# Patient Record
Sex: Female | Born: 1970 | Race: Black or African American | Hispanic: No | Marital: Married | Smoking: Never smoker
Health system: Southern US, Community
[De-identification: ages and names within clinical notes are randomized; demographics above are authoritative.]

## PROBLEM LIST (undated history)

## (undated) DIAGNOSIS — E079 Disorder of thyroid, unspecified: Secondary | ICD-10-CM

## (undated) HISTORY — DX: Disorder of thyroid, unspecified: E07.9

---

## 1998-06-02 ENCOUNTER — Emergency Department (HOSPITAL_COMMUNITY): Admission: EM | Admit: 1998-06-02 | Discharge: 1998-06-02 | Payer: Self-pay | Admitting: Internal Medicine

## 1999-05-07 ENCOUNTER — Other Ambulatory Visit: Admission: RE | Admit: 1999-05-07 | Discharge: 1999-05-07 | Payer: Self-pay | Admitting: *Deleted

## 2000-01-22 HISTORY — PX: FOOT SURGERY: SHX648

## 2002-05-14 ENCOUNTER — Other Ambulatory Visit: Admission: RE | Admit: 2002-05-14 | Discharge: 2002-05-14 | Payer: Self-pay | Admitting: Family Medicine

## 2003-07-01 ENCOUNTER — Other Ambulatory Visit: Admission: RE | Admit: 2003-07-01 | Discharge: 2003-07-01 | Payer: Self-pay | Admitting: Obstetrics and Gynecology

## 2003-09-15 ENCOUNTER — Ambulatory Visit (HOSPITAL_COMMUNITY): Admission: RE | Admit: 2003-09-15 | Discharge: 2003-09-15 | Payer: Self-pay | Admitting: Obstetrics and Gynecology

## 2003-11-16 ENCOUNTER — Encounter: Admission: RE | Admit: 2003-11-16 | Discharge: 2003-11-16 | Payer: Self-pay | Admitting: Family Medicine

## 2004-07-25 ENCOUNTER — Ambulatory Visit: Payer: Self-pay | Admitting: Family Medicine

## 2004-07-26 ENCOUNTER — Other Ambulatory Visit: Admission: RE | Admit: 2004-07-26 | Discharge: 2004-07-26 | Payer: Self-pay | Admitting: Family Medicine

## 2004-07-26 ENCOUNTER — Ambulatory Visit: Payer: Self-pay | Admitting: Family Medicine

## 2005-06-25 ENCOUNTER — Other Ambulatory Visit: Admission: RE | Admit: 2005-06-25 | Discharge: 2005-06-25 | Payer: Self-pay | Admitting: Family Medicine

## 2005-06-25 ENCOUNTER — Encounter: Payer: Self-pay | Admitting: Family Medicine

## 2005-06-25 ENCOUNTER — Ambulatory Visit: Payer: Self-pay | Admitting: Family Medicine

## 2005-07-01 ENCOUNTER — Ambulatory Visit: Payer: Self-pay | Admitting: Internal Medicine

## 2006-06-27 DIAGNOSIS — E039 Hypothyroidism, unspecified: Secondary | ICD-10-CM | POA: Insufficient documentation

## 2006-07-24 ENCOUNTER — Ambulatory Visit (HOSPITAL_COMMUNITY): Admission: RE | Admit: 2006-07-24 | Discharge: 2006-07-24 | Payer: Self-pay | Admitting: Obstetrics and Gynecology

## 2006-08-27 ENCOUNTER — Ambulatory Visit (HOSPITAL_COMMUNITY): Admission: RE | Admit: 2006-08-27 | Discharge: 2006-08-27 | Payer: Self-pay | Admitting: Obstetrics and Gynecology

## 2006-10-14 ENCOUNTER — Encounter: Payer: Self-pay | Admitting: Family Medicine

## 2006-11-10 ENCOUNTER — Ambulatory Visit: Payer: Self-pay | Admitting: Family Medicine

## 2007-12-29 ENCOUNTER — Telehealth (INDEPENDENT_AMBULATORY_CARE_PROVIDER_SITE_OTHER): Payer: Self-pay | Admitting: *Deleted

## 2008-02-10 ENCOUNTER — Encounter: Payer: Self-pay | Admitting: Family Medicine

## 2008-04-01 ENCOUNTER — Ambulatory Visit: Payer: Self-pay | Admitting: Family Medicine

## 2009-01-19 ENCOUNTER — Telehealth (INDEPENDENT_AMBULATORY_CARE_PROVIDER_SITE_OTHER): Payer: Self-pay | Admitting: *Deleted

## 2009-06-14 ENCOUNTER — Encounter: Payer: Self-pay | Admitting: Family Medicine

## 2009-06-14 LAB — CONVERTED CEMR LAB
HDL: 56 mg/dL
LDL Cholesterol: 91 mg/dL

## 2009-06-15 ENCOUNTER — Telehealth (INDEPENDENT_AMBULATORY_CARE_PROVIDER_SITE_OTHER): Payer: Self-pay | Admitting: *Deleted

## 2009-07-06 ENCOUNTER — Other Ambulatory Visit: Admission: RE | Admit: 2009-07-06 | Discharge: 2009-07-06 | Payer: Self-pay | Admitting: Family Medicine

## 2009-07-06 ENCOUNTER — Ambulatory Visit: Payer: Self-pay | Admitting: Family Medicine

## 2009-07-06 LAB — HM PAP SMEAR

## 2009-07-10 ENCOUNTER — Encounter (INDEPENDENT_AMBULATORY_CARE_PROVIDER_SITE_OTHER): Payer: Self-pay | Admitting: *Deleted

## 2009-07-10 LAB — CONVERTED CEMR LAB: Pap Smear: NEGATIVE

## 2010-02-20 NOTE — Letter (Signed)
Summary: Results Follow up Letter  [facility]  [address] [place], [place] [address]   Phone: [phone]  Fax: [phone]    07/10/2009 MRN: [id number]  Leah Ingram [address] [place], [place]  [address]  Dear [name],  The following are the results of your recent test(s):  Test         Result    Pap Smear:        Normal __x___  Not Normal _____ Comments: ______________________________________________________ Cholesterol: LDL(Bad cholesterol):         Your goal is less than:         HDL (Good cholesterol):       Your goal is more than: Comments:  ______________________________________________________ Mammogram:        Normal _____  Not Normal _____ Comments:  ___________________________________________________________________ Hemoccult:        Normal _____  Not normal _______ Comments:    _____________________________________________________________________ Other Tests:    We routinely do not discuss normal results over the telephone.  If you desire a copy of the results, or you have any questions about this information we can discuss them at your next office visit.   Sincerely,    Army Fossa CMA  July 10, 2009 2:34 PM

## 2010-02-20 NOTE — Progress Notes (Signed)
Summary: refill  Phone Note Refill Request Message from:  Patient on Jun 15, 2009 1:24 PM  Refills Requested: Medication #1:  SYNTHROID 88 MCG TABS 1 by mouth once daily [BMN] [facility] ---    Method Requested: Fax to Local Pharmacy Next Appointment Scheduled: [phone] Initial call taken by: Okey Regal [place],  Jun 15, 2009 1:25 PM    Prescriptions: SYNTHROID 88 MCG TABS (LEVOTHYROXINE SODIUM) 1 by mouth once daily Brand medically necessary #90 x 0   Entered by:   Army Fossa CMA   Authorized by:   Loreen Freud DO   Signed by:   Army Fossa CMA on 06/15/2009   Method used:   [facility]ronically to        [phone] (retail)       [address]       [place], [place]  [address]       Ph: [phone]       Fax: [phone]   RxID:   [id number]

## 2010-02-20 NOTE — Assessment & Plan Note (Signed)
Summary: CPX-PAP--PH   Vital Signs:  Patient profile:   40 year old female Menstrual status:  regular Height:      70 inches Weight:      204 pounds BMI:     29.38 Pulse rate:   68 / minute Pulse rhythm:   regular BP sitting:   120 / 80  (left arm) Cuff size:   regular  Vitals Entered By: Army Fossa CMA (July 06, 2009 2:25 PM) CC: Pt here for CPX, pap.   History of Present Illness: Pt here for cpe and pap.  Labs were done at work and pt brought them in for review.  Preventive Screening-Counseling & Management  Alcohol-Tobacco     Alcohol drinks/day: 0     Smoking Status: never     Passive Smoke Exposure: no  Caffeine-Diet-Exercise     Caffeine use/day: 1 cup a week     Caffeine Counseling: not indicated; caffeine use is not excessive or problematic     Diet Comments: watching what she eats     Diet Counseling: not indicated; diet is assessed to be healthy     Does Patient Exercise: no     Exercise Counseling: to improve exercise regimen  Hep-HIV-STD-Contraception     Hepatitis Risk: no risk noted     HIV Risk: no risk noted     STD Risk: no risk noted     Dental Visit-last 6 months yes     Dental Care Counseling: not indicated; dental care within six months     SBE monthly: yes     SBE Education/Counseling: to perform regular SBE  Safety-Violence-Falls     Seat Belt Use: yes     Seat Belt Counseling: not indicated; patient wears seat belts     Firearms in the Home: no firearms in the home     Firearm Counseling: not applicable     Smoke Detectors: yes     Smoke Detector Counseling: yes     Violence in the Home: no risk noted     Violence Counseling: not applicable     Sexual Abuse: no     Sexual Abuse Counseling: no      Sexual History:  currently monogamous and married.    Current Medications (verified): 1)  Synthroid 88 Mcg Tabs (Levothyroxine Sodium) .Marland Kitchen.. 1 By Mouth Once Daily 2)  Pnv .Marland Kitchen.. 1 By Mouth Once Daily  Allergies: 1)  !  Penicillin  Past History:  Past Medical History: Last updated: 04/01/2008 Hypothyroidism P2G1011  Past Surgical History: Last updated: 06/27/2006 Foot sx-2002  Family History: Last updated: 06/27/2006 Family History Diabetes 1st degree relative Family History Hypertension Family History Arrthymia Family History Heart dz  Social History: Last updated: 07/06/2009 Married Never Smoked Alcohol use-yes-rare Drug use-no Occupation: Economist --- cust service  Risk Factors: Alcohol Use: 0 (07/06/2009) Caffeine Use: 1 cup a week (07/06/2009) Diet: watching what she eats (07/06/2009) Exercise: no (07/06/2009)  Risk Factors: Smoking Status: never (07/06/2009) Passive Smoke Exposure: no (07/06/2009)  Family History: Reviewed history from 06/27/2006 and no changes required. Family History Diabetes 1st degree relative Family History Hypertension Family History Arrthymia Family History Heart dz  Social History: Reviewed history from 06/27/2006 and no changes required. Married Never Smoked Alcohol use-yes-rare Drug use-no Occupation: Economist --- cust service Does Patient Exercise:  no Dental Care w/in 6 mos.:  yes Sexual History:  currently monogamous, married Occupation:  employed  Review of Systems      See HPI General:  Denies  chills, fatigue, fever, loss of appetite, malaise, sleep disorder, sweats, weakness, and weight loss. Eyes:  Denies blurring, discharge, double vision, eye irritation, eye pain, halos, itching, light sensitivity, red eye, vision loss-1 eye, and vision loss-both eyes; optho--q1y. ENT:  Denies decreased hearing, difficulty swallowing, ear discharge, earache, hoarseness, nasal congestion, nosebleeds, postnasal drainage, ringing in ears, sinus pressure, and sore throat. CV:  Denies bluish discoloration of lips or nails, chest pain or discomfort, difficulty breathing at night, difficulty breathing while lying down, fainting, fatigue, leg cramps with  exertion, lightheadness, near fainting, palpitations, shortness of breath with exertion, swelling of feet, swelling of hands, and weight gain. Resp:  Denies chest discomfort, chest pain with inspiration, cough, coughing up blood, excessive snoring, hypersomnolence, morning headaches, pleuritic, shortness of breath, sputum productive, and wheezing. GI:  Denies abdominal pain, bloody stools, change in bowel habits, constipation, dark tarry stools, diarrhea, excessive appetite, gas, hemorrhoids, indigestion, and loss of appetite. GU:  Denies abnormal vaginal bleeding, decreased libido, discharge, dysuria, genital sores, hematuria, incontinence, nocturia, urinary frequency, and urinary hesitancy. MS:  Denies joint pain, joint redness, joint swelling, loss of strength, low back pain, mid back pain, muscle aches, muscle , cramps, muscle weakness, stiffness, and thoracic pain. Derm:  Denies changes in color of skin, changes in nail beds, dryness, excessive perspiration, flushing, hair loss, insect bite(s), itching, lesion(s), poor wound healing, and rash. Neuro:  Denies brief paralysis, difficulty with concentration, disturbances in coordination, falling down, headaches, inability to speak, memory loss, numbness, poor balance, seizures, sensation of room spinning, tingling, tremors, visual disturbances, and weakness. Psych:  Denies alternate hallucination ( auditory/visual), anxiety, depression, easily angered, easily tearful, irritability, mental problems, panic attacks, sense of great danger, suicidal thoughts/plans, thoughts of violence, unusual visions or sounds, and thoughts /plans of harming others. Endo:  Denies cold intolerance, excessive hunger, excessive thirst, excessive urination, heat intolerance, polyuria, and weight change. Heme:  Denies abnormal bruising, bleeding, enlarge lymph nodes, fevers, pallor, and skin discoloration. Allergy:  Denies hives or rash, itching eyes, persistent infections,  seasonal allergies, and sneezing.  Physical Exam  General:  Well-developed,well-nourished,in no acute distress; alert,appropriate and cooperative throughout examination Head:  Normocephalic and atraumatic without obvious abnormalities. No apparent alopecia or balding. Eyes:  vision grossly intact, pupils equal, pupils round, pupils reactive to light, and no injection.   Ears:  External ear exam shows no significant lesions or deformities.  Otoscopic examination reveals clear canals, tympanic membranes are intact bilaterally without bulging, retraction, inflammation or discharge. Hearing is grossly normal bilaterally. Nose:  External nasal examination shows no deformity or inflammation. Nasal mucosa are pink and moist without lesions or exudates. Mouth:  Oral mucosa and oropharynx without lesions or exudates.  Teeth in good repair. Neck:  No deformities, masses, or tenderness noted.no carotid bruits.   Chest Wall:  No deformities, masses, or tenderness noted. Breasts:  No mass, nodules, thickening, tenderness, bulging, retraction, inflamation, nipple discharge or skin changes noted.   Lungs:  Normal respiratory effort, chest expands symmetrically. Lungs are clear to auscultation, no crackles or wheezes. Heart:  normal rate and no murmur.   Abdomen:  Bowel sounds positive,abdomen soft and non-tender without masses, organomegaly or hernias noted. Genitalia:  Pelvic Exam:        External: normal female genitalia without lesions or masses        Vagina: normal without lesions or masses        Cervix: normal without lesions or masses        Adnexa: normal  bimanual exam without masses or fullness        Uterus: normal by palpation        Pap smear: performed Msk:  normal ROM, no joint tenderness, no joint swelling, no joint warmth, no redness over joints, no joint deformities, no joint instability, and no crepitation.   Pulses:  R posterior tibial normal, R dorsalis pedis normal, R carotid normal, L  posterior tibial normal, L dorsalis pedis normal, and L carotid normal.   Extremities:  No clubbing, cyanosis, edema, or deformity noted with normal full range of motion of all joints.   Neurologic:  No cranial nerve deficits noted. Station and gait are normal. Plantar reflexes are down-going bilaterally. DTRs are symmetrical throughout. Sensory, motor and coordinative functions appear intact. Skin:  Intact without suspicious lesions or rashes Cervical Nodes:  No lymphadenopathy noted Axillary Nodes:  No palpable lymphadenopathy Psych:  Cognition and judgment appear intact. Alert and cooperative with normal attention span and concentration. No apparent delusions, illusions, hallucinations   Impression & Recommendations:  Problem # 1:  PREVENTIVE HEALTH CARE (ICD-V70.0) labs from work reviewed ghm utd  Problem # 2:  HYPOTHYROIDISM (ICD-244.9)  labs from work reviewed Her updated medication list for this problem includes:    Synthroid 88 Mcg Tabs (Levothyroxine sodium) .Marland Kitchen... 1 by mouth once daily  Labs Reviewed: HDL: 56 (06/14/2009)   LDL: 91 (06/14/2009)     Complete Medication List: 1)  Synthroid 88 Mcg Tabs (Levothyroxine sodium) .Marland Kitchen.. 1 by mouth once daily 2)  Pnv  .Marland KitchenMarland Kitchen. 1 by mouth once daily Prescriptions: SYNTHROID 88 MCG TABS (LEVOTHYROXINE SODIUM) 1 by mouth once daily Brand medically necessary #90 x 3   Entered and Authorized by:   Loreen Freud DO   Signed by:   Loreen Freud DO on 07/06/2009   Method used:   Printed then faxed to ...       [facility] (mail-order)       [address]       [place], [place]  [address]       Ph: [phone]       Fax: [phone]   RxID:   [id number] SYNTHROID 88 MCG TABS (LEVOTHYROXINE SODIUM) 1 by mouth once daily Brand medically necessary #90 x 3   Entered and Authorized by:   Loreen Freud DO   Signed by:   Loreen Freud DO on 07/06/2009   Method used:   Print then Give to Patient   RxID:   [id number]   Flu Vaccine Result Date:   11/30/2008 Flu Vaccine Result:  given Flu Vaccine Next Due:  1 yr HDL Result Date:  06/14/2009 HDL Result:  56 HDL Next Due:  1 yr LDL Result Date:  06/14/2009 LDL Result:  91 LDL Next Due:  1 yr           Appended Document: CPX-PAP--PH    Clinical Lists Changes  Orders: Added new Service order of Tdap => 54yrs IM (64403) - Signed Added new Service order of Admin 1st Vaccine (47425) - Signed Observations: Added new observation of TD BOOSTERLO: [id number] (07/06/2009 14:56) Added new observation of TD BOOST EXP: 04/15/2011 (07/06/2009 14:56) Added new observation of TD BOOSTERBY: Danielle Barmer CMA (07/06/2009 14:56) Added new observation of TD BOOSTERRT: IM (07/06/2009 14:56) Added new observation of TDBOOSTERDSE: 0.5 ml (07/06/2009 14:56) Added new observation of TD BOOSTERMF: GlaxoSmithKline (07/06/2009 14:56) Added new observation of TD BOOST SIT: left deltoid (07/06/2009 14:56) Added new observation of TD BOOSTER: Tdap (07/06/2009 14:56)  Immunizations Administered:  Tetanus Vaccine:    Vaccine Type: Tdap    Site: left deltoid    Mfr: GlaxoSmithKline    Dose: 0.5 ml    Route: IM    Given by: Army Fossa CMA    Exp. Date: 04/15/2011    Lot #: [id number]

## 2010-03-09 ENCOUNTER — Encounter: Payer: Self-pay | Admitting: Family Medicine

## 2010-03-09 ENCOUNTER — Ambulatory Visit (INDEPENDENT_AMBULATORY_CARE_PROVIDER_SITE_OTHER): Payer: Managed Care, Other (non HMO) | Admitting: Family Medicine

## 2010-03-09 ENCOUNTER — Other Ambulatory Visit: Payer: Self-pay | Admitting: Family Medicine

## 2010-03-09 DIAGNOSIS — M25519 Pain in unspecified shoulder: Secondary | ICD-10-CM

## 2010-03-09 DIAGNOSIS — N6459 Other signs and symptoms in breast: Secondary | ICD-10-CM

## 2010-03-09 DIAGNOSIS — N63 Unspecified lump in unspecified breast: Secondary | ICD-10-CM | POA: Insufficient documentation

## 2010-03-14 ENCOUNTER — Encounter: Payer: Self-pay | Admitting: Family Medicine

## 2010-03-14 ENCOUNTER — Ambulatory Visit
Admission: RE | Admit: 2010-03-14 | Discharge: 2010-03-14 | Disposition: A | Discharge status: Home / Self Care | Payer: Managed Care, Other (non HMO) | Source: Ambulatory Visit | Attending: Family Medicine | Admitting: Family Medicine

## 2010-03-14 DIAGNOSIS — N6459 Other signs and symptoms in breast: Secondary | ICD-10-CM

## 2010-03-14 NOTE — Assessment & Plan Note (Signed)
Summary: cold, area under left breast feels "bigger", no pain or lumps...   Vital Signs:  Patient profile:   40 year old female Menstrual status:  regular Weight:      205.6 pounds Temp:     98.8 degrees F oral BP sitting:   112 / 60  (left arm) Cuff size:   large  Vitals Entered By: Almeta Monas CMA Duncan Dull) (March 09, 2010 2:03 PM) CC: x3weeks c/o issues with the left breast-- also c/o right shoulder pain x61mos   History of Present Illness: Pt here c/o L breast feeling swollen and cold.  no pain, no lumps felt. Pt also c/o R shoulder pain x 6 months.  No known injury.  Current Medications (verified): 1)  Synthroid 88 Mcg Tabs (Levothyroxine Sodium) .Marland Kitchen.. 1 By Mouth Once Daily 2)  Pnv .Marland Kitchen.. 1 By Mouth Once Daily 3)  Celebrex 200 Mg Caps (Celecoxib) .Marland Kitchen.. 1 By Mouth Once Daily  Allergies (verified): 1)  ! Penicillin  Past History:  Family History: Last updated: 06/27/2006 Family History Diabetes 1st degree relative Family History Hypertension Family History Arrthymia Family History Heart dz  Social History: Last updated: 07/06/2009 Married Never Smoked Alcohol use-yes-rare Drug use-no Occupation: Economist --- cust service  Risk Factors: Alcohol Use: 0 (07/06/2009) Caffeine Use: 1 cup a week (07/06/2009) Diet: watching what she eats (07/06/2009) Exercise: no (07/06/2009)  Risk Factors: Smoking Status: never (07/06/2009) Passive Smoke Exposure: no (07/06/2009)  Past medical, surgical, family and social histories (including risk factors) reviewed for relevance to current acute and chronic problems.  Past Medical History: Reviewed history from 04/01/2008 and no changes required. Hypothyroidism P2G1011  Past Surgical History: Reviewed history from 06/27/2006 and no changes required. Foot sx-2002  Family History: Reviewed history from 06/27/2006 and no changes required. Family History Diabetes 1st degree relative Family History Hypertension Family  History Arrthymia Family History Heart dz  Social History: Reviewed history from 07/06/2009 and no changes required. Married Never Smoked Alcohol use-yes-rare Drug use-no Occupation: Economist --- cust service  Review of Systems      See HPI  Physical Exam  General:  Well-developed,well-nourished,in no acute distress; alert,appropriate and cooperative throughout examination Breasts:  L upper outer quad thickening skin/areolae normal, no masses, no nipple discharge, no tenderness, and no adenopathy.   Msk:  + tenderness R AC joint  pt able to move with full ROM but it does hurt to do it Extremities:  No clubbing, cyanosis, edema, or deformity noted with normal full range of motion of all joints.   Skin:  Intact without suspicious lesions or rashes Psych:  Oriented X3 and normally interactive.     Impression & Recommendations:  Problem # 1:  SHOULDER PAIN, RIGHT (ICD-719.41)  Discussed shoulder exercises, use of moist heat or ice, and medication. --- ortho if no better  Her updated medication list for this problem includes:    Celebrex 200 Mg Caps (Celecoxib) .Marland Kitchen... 1 by mouth once daily  Problem # 2:  BREAST MASS, LEFT (ICD-611.72)  Orders: Radiology Referral (Radiology)  Mammogram was ordered today.  Complete Medication List: 1)  Synthroid 88 Mcg Tabs (Levothyroxine sodium) .Marland Kitchen.. 1 by mouth once daily 2)  Pnv  .Marland KitchenMarland Kitchen. 1 by mouth once daily 3)  Celebrex 200 Mg Caps (Celecoxib) .Marland Kitchen.. 1 by mouth once daily   Orders Added: 1)  Est. Patient Level III [[address]] 2)  Radiology Referral [Radiology]

## 2010-07-07 ENCOUNTER — Encounter: Payer: Self-pay | Admitting: Family Medicine

## 2010-07-24 ENCOUNTER — Encounter: Payer: Self-pay | Admitting: Family Medicine

## 2010-07-24 ENCOUNTER — Ambulatory Visit (INDEPENDENT_AMBULATORY_CARE_PROVIDER_SITE_OTHER): Payer: Managed Care, Other (non HMO) | Admitting: Family Medicine

## 2010-07-24 ENCOUNTER — Other Ambulatory Visit (HOSPITAL_COMMUNITY)
Admission: RE | Admit: 2010-07-24 | Discharge: 2010-07-24 | Disposition: A | Discharge status: Home / Self Care | Payer: Managed Care, Other (non HMO) | Source: Ambulatory Visit | Attending: Family Medicine | Admitting: Family Medicine

## 2010-07-24 VITALS — BP 122/74 | HR 80 | Temp 98.8°F | Ht 70.0 in | Wt 208.2 lb

## 2010-07-24 DIAGNOSIS — E039 Hypothyroidism, unspecified: Secondary | ICD-10-CM

## 2010-07-24 DIAGNOSIS — Z0000 Encounter for general adult medical examination without abnormal findings: Secondary | ICD-10-CM

## 2010-07-24 DIAGNOSIS — Z01419 Encounter for gynecological examination (general) (routine) without abnormal findings: Secondary | ICD-10-CM | POA: Insufficient documentation

## 2010-07-24 MED ORDER — LEVOTHYROXINE SODIUM 88 MCG PO TABS: 88.0000 ug | ORAL_TABLET | Freq: Every day | ORAL | Status: DC

## 2010-07-24 NOTE — Progress Notes (Signed)
  Subjective:     Leah Ingram is a 40 y.o. female and is here for a comprehensive physical exam. The patient reports no problems.  History   Social History  . Marital Status: Married    Spouse Name: N/A    Number of Children: N/A  . Years of Education: N/A   Occupational History  . Not on file.   Social History Main Topics  . Smoking status: Never Smoker   . Smokeless tobacco: Never Used  . Alcohol Use: Yes     very rare  . Drug Use: No  . Sexually Active: Yes -- Female partner(s)   Other Topics Concern  . Not on file   Social History Narrative  . No narrative on file   Health Maintenance  Topic Date Due  . Pap Smear  07/06/2012  . Tetanus/tdap  07/07/2019    The following portions of the patient's history were reviewed and updated as appropriate: allergies, current medications, past family history, past medical history, past social history, past surgical history and problem list.  Review of Systems Review of Systems  Constitutional: Negative for activity change, appetite change and fatigue.  HENT: Negative for hearing loss, congestion, tinnitus and ear discharge.  dentist q1m Eyes: Negative for visual disturbance (see optho q1y -- vision corrected to 20/20 with glasses).  Respiratory: Negative for cough, chest tightness and shortness of breath.   Cardiovascular: Negative for chest pain, palpitations and leg swelling.  Gastrointestinal: Negative for abdominal pain, diarrhea, constipation and abdominal distention.  Genitourinary: Negative for urgency, frequency, decreased urine volume and difficulty urinating.  Musculoskeletal: Negative for back pain, arthralgias and gait problem.  Skin: Negative for color change, pallor and rash.  Neurological: Negative for dizziness, light-headedness, numbness and headaches.  Hematological: Negative for adenopathy. Does not bruise/bleed easily.  Psychiatric/Behavioral: Negative for suicidal ideas, confusion, sleep disturbance,  self-injury, dysphoric mood, decreased concentration and agitation.       Objective:    BP 122/74  Pulse 80  Temp(Src) 98.8 F (37.1 C) (Oral)  Ht 5\' 10"  (1.778 m)  Wt 208 lb 3.2 oz (94.439 kg)  BMI 29.87 kg/m2  SpO2 97%  LMP 07/13/2010 General appearance: alert, cooperative, appears stated age and no distress Head: Normocephalic, without obvious abnormality, atraumatic Eyes: conjunctivae/corneas clear. PERRL, EOM's intact. Fundi benign. Ears: normal TM's and external ear canals both ears Nose: Nares normal. Septum midline. Mucosa normal. No drainage or sinus tenderness. Throat: lips, mucosa, and tongue normal; teeth and gums normal Neck: no adenopathy, no carotid bruit, no JVD, supple, symmetrical, trachea midline and thyroid not enlarged, symmetric, no tenderness/mass/nodules Back: symmetric, no curvature. ROM normal. No CVA tenderness. Lungs: clear to auscultation bilaterally Breasts: normal appearance, no masses or tenderness Heart: regular rate and rhythm, S1, S2 normal, no murmur, click, rub or gallop Abdomen: soft, non-tender; bowel sounds normal; no masses,  no organomegaly Pelvic: cervix normal in appearance, external genitalia normal, no adnexal masses or tenderness, no cervical motion tenderness, rectovaginal septum normal, uterus normal size, shape, and consistency and vagina normal without discharge Extremities: extremities normal, atraumatic, no cyanosis or edema Pulses: 2+ and symmetric Skin: Skin color, texture, turgor normal. No rashes or lesions Lymph nodes: Cervical, supraclavicular, and axillary nodes normal. Neurologic: Grossly normal Psych--no depression, no anxiety   Assessment:    Healthy female exam.    Plan:     See After Visit Summary for Counseling Recommendations

## 2010-07-24 NOTE — Assessment & Plan Note (Signed)
Labs reviewed with pt---done at work and scanned in con't meds

## 2010-07-24 NOTE — Patient Instructions (Signed)

## 2011-08-15 ENCOUNTER — Telehealth: Payer: Self-pay | Admitting: Family Medicine

## 2011-08-15 DIAGNOSIS — E039 Hypothyroidism, unspecified: Secondary | ICD-10-CM

## 2011-08-15 MED ORDER — LEVOTHYROXINE SODIUM 88 MCG PO TABS: 88.0000 ug | ORAL_TABLET | Freq: Every day | ORAL | Status: DC

## 2011-08-15 NOTE — Telephone Encounter (Signed)
Refill: synthroid tab. [facility]

## 2011-08-16 MED ORDER — LEVOTHYROXINE SODIUM 88 MCG PO TABS: 88.0000 ug | ORAL_TABLET | Freq: Every day | ORAL | Status: DC

## 2011-08-16 NOTE — Addendum Note (Signed)
Addended by: Arnette Norris on: 08/16/2011 11:04 AM   Modules accepted: Orders

## 2011-08-23 ENCOUNTER — Encounter: Payer: Managed Care, Other (non HMO) | Admitting: Family Medicine

## 2011-10-11 ENCOUNTER — Ambulatory Visit (INDEPENDENT_AMBULATORY_CARE_PROVIDER_SITE_OTHER): Payer: Managed Care, Other (non HMO) | Admitting: Family Medicine

## 2011-10-11 ENCOUNTER — Other Ambulatory Visit (HOSPITAL_COMMUNITY)
Admission: RE | Admit: 2011-10-11 | Discharge: 2011-10-11 | Disposition: A | Discharge status: Home / Self Care | Payer: Managed Care, Other (non HMO) | Source: Ambulatory Visit | Attending: Family Medicine | Admitting: Family Medicine

## 2011-10-11 ENCOUNTER — Encounter: Payer: Self-pay | Admitting: Family Medicine

## 2011-10-11 VITALS — BP 120/72 | HR 77 | Temp 98.5°F | Ht 69.25 in | Wt 212.4 lb

## 2011-10-11 DIAGNOSIS — Z0000 Encounter for general adult medical examination without abnormal findings: Secondary | ICD-10-CM

## 2011-10-11 DIAGNOSIS — E039 Hypothyroidism, unspecified: Secondary | ICD-10-CM

## 2011-10-11 DIAGNOSIS — Z01419 Encounter for gynecological examination (general) (routine) without abnormal findings: Secondary | ICD-10-CM | POA: Insufficient documentation

## 2011-10-11 DIAGNOSIS — Z124 Encounter for screening for malignant neoplasm of cervix: Secondary | ICD-10-CM

## 2011-10-11 LAB — CBC WITH DIFFERENTIAL/PLATELET
Basophils Absolute: 0 10*3/uL (ref 0.0–0.1)
Basophils Relative: 0.6 % (ref 0.0–3.0)
Eosinophils Absolute: 0.1 10*3/uL (ref 0.0–0.7)
Eosinophils Relative: 2 % (ref 0.0–5.0)
HCT: 37.5 % (ref 36.0–46.0)
Hemoglobin: 12.3 g/dL (ref 12.0–15.0)
Lymphocytes Relative: 33 % (ref 12.0–46.0)
Lymphs Abs: 2 10*3/uL (ref 0.7–4.0)
MCHC: 32.7 g/dL (ref 30.0–36.0)
MCV: 86.6 fl (ref 78.0–100.0)
Monocytes Absolute: 0.4 10*3/uL (ref 0.1–1.0)
Monocytes Relative: 6.9 % (ref 3.0–12.0)
Neutro Abs: 3.4 10*3/uL (ref 1.4–7.7)
Neutrophils Relative %: 57.5 % (ref 43.0–77.0)
Platelets: 179 10*3/uL (ref 150.0–400.0)
RBC: 4.33 Mil/uL (ref 3.87–5.11)
RDW: 14.2 % (ref 11.5–14.6)
WBC: 5.9 10*3/uL (ref 4.5–10.5)

## 2011-10-11 LAB — HEPATIC FUNCTION PANEL
ALT: 13 U/L (ref 0–35)
AST: 17 U/L (ref 0–37)
Albumin: 3.8 g/dL (ref 3.5–5.2)
Alkaline Phosphatase: 94 U/L (ref 39–117)
Bilirubin, Direct: 0 mg/dL (ref 0.0–0.3)
Total Bilirubin: 0.4 mg/dL (ref 0.3–1.2)
Total Protein: 7.7 g/dL (ref 6.0–8.3)

## 2011-10-11 LAB — BASIC METABOLIC PANEL
BUN: 11 mg/dL (ref 6–23)
CO2: 28 mEq/L (ref 19–32)
Calcium: 9 mg/dL (ref 8.4–10.5)
Chloride: 104 mEq/L (ref 96–112)
Creatinine, Ser: 0.8 mg/dL (ref 0.4–1.2)
GFR: 107.38 mL/min (ref >60.00)
Glucose, Bld: 87 mg/dL (ref 70–99)
Potassium: 3.4 mEq/L — ABNORMAL LOW (ref 3.5–5.1)
Sodium: 137 mEq/L (ref 135–145)

## 2011-10-11 LAB — LIPID PANEL
Cholesterol: 159 mg/dL (ref 0–200)
HDL: 45.2 mg/dL (ref >39.00)
LDL Cholesterol: 100 mg/dL — ABNORMAL HIGH (ref 0–99)
Total CHOL/HDL Ratio: 4
Triglycerides: 70 mg/dL (ref 0.0–149.0)
VLDL: 14 mg/dL (ref 0.0–40.0)

## 2011-10-11 LAB — TSH: TSH: 1.2 u[IU]/mL (ref 0.35–5.50)

## 2011-10-11 NOTE — Assessment & Plan Note (Signed)
Check lab Con't meds

## 2011-10-11 NOTE — Progress Notes (Signed)
Subjective:     Leah Ingram is a 41 y.o. female and is here for a comprehensive physical exam. The patient reports no problems.  History   Social History  . Marital Status: Married    Spouse Name: N/A    Number of Children: N/A  . 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 Smoking status: Never Smoker   . Smokeless tobacco: Never Used  . Alcohol Use: Yes     very rare  . Drug Use: No  . Sexually Active: Yes -- Female partner(s)   Other Topics Concern  . Not on file   Social History Narrative   Exercise--  no   Health Maintenance  Topic Date Due  . Influenza Vaccine  09/22/2011  . Pap Smear  10/11/2014  . Tetanus/tdap  07/07/2019    The following portions of the patient's history were reviewed and updated as appropriate: allergies, current medications, past family history, past medical history, past social history, past surgical history and problem list.  Review of Systems Review of Systems  Constitutional: Negative for activity change, appetite change and fatigue.  HENT: Negative for hearing loss, congestion, tinnitus and ear discharge.  dentist q31m Eyes: Negative for visual disturbance (see optho q1y -- vision corrected to 20/20 with contacts).  Respiratory: Negative for cough, chest tightness and shortness of breath.   Cardiovascular: Negative for chest pain, palpitations and leg swelling.  Gastrointestinal: Negative for abdominal pain, diarrhea, constipation and abdominal distention.  Genitourinary: Negative for urgency, frequency, decreased urine volume and difficulty urinating.  Musculoskeletal: Negative for back pain, arthralgias and gait problem.  Skin: Negative for color change, pallor and rash.  Neurological: Negative for dizziness, light-headedness, numbness and headaches.  Hematological: Negative for adenopathy. Does not bruise/bleed easily.  Psychiatric/Behavioral: Negative for suicidal ideas, confusion,  sleep disturbance, self-injury, dysphoric mood, decreased concentration and agitation.       Objective:    BP 120/72  Pulse 77  Temp 98.5 F (36.9 C) (Oral)  Ht 5' 9.25" (1.759 m)  Wt 212 lb 6.4 oz (96.344 kg)  BMI 31.14 kg/m2  SpO2 95%  LMP 09/20/2011 General appearance: alert, cooperative, appears stated age and no distress Head: Normocephalic, without obvious abnormality, atraumatic Eyes: conjunctivae/corneas clear. PERRL, EOM's intact. Fundi benign. Ears: normal TM's and external ear canals both ears Nose: Nares normal. Septum midline. Mucosa normal. No drainage or sinus tenderness. Throat: lips, mucosa, and tongue normal; teeth and gums normal Neck: no adenopathy, no carotid bruit, no JVD, supple, symmetrical, trachea midline and thyroid not enlarged, symmetric, no tenderness/mass/nodules Back: symmetric, no curvature. ROM normal. No CVA tenderness. Lungs: clear to auscultation bilaterally Breasts: normal appearance, no masses or tenderness Heart: regular rate and rhythm, S1, S2 normal, no murmur, click, rub or gallop Abdomen: soft, non-tender; bowel sounds normal; no masses,  no organomegaly Pelvic: cervix normal in appearance, external genitalia normal, no adnexal masses or tenderness, no cervical motion tenderness, rectovaginal septum normal, uterus normal size, shape, and consistency and vagina normal without discharge Extremities: extremities normal, atraumatic, no cyanosis or edema Pulses: 2+ and symmetric Skin: Skin color, texture, turgor normal. No rashes or lesions Lymph nodes: Cervical, supraclavicular, and axillary nodes normal. Neurologic: Alert and oriented X 3, normal strength and tone. Normal symmetric reflexes. Normal coordination and gait psych-- no depression, anxiety    Assessment:    Healthy female exam.      Plan:  Check labs  ghm utd See After Visit Summary for Counseling Recommendations

## 2011-10-11 NOTE — Patient Instructions (Addendum)
Preventive Care for Adults, Female A healthy lifestyle and preventive care can promote health and wellness. Preventive health guidelines for women include the following key practices.  A routine yearly physical is a good way to check with your caregiver about your health and preventive screening. It is a chance to share any concerns and updates on your health, and to receive a thorough exam.   Visit your dentist for a routine exam and preventive care every 6 months. Brush your teeth twice a day and floss once a day. Good oral hygiene prevents tooth decay and gum disease.   The frequency of eye exams is based on your age, health, family medical history, use of contact lenses, and other factors. Follow your caregiver's recommendations for frequency of eye exams.   Eat a healthy diet. Foods like vegetables, fruits, whole grains, low-fat dairy products, and lean protein foods contain the nutrients you need without too many calories. Decrease your intake of foods high in solid fats, added sugars, and salt. Eat the right amount of calories for you.Get information about a proper diet from your caregiver, if necessary.   Regular physical exercise is one of the most important things you can do for your health. Most adults should get at least 150 minutes of moderate-intensity exercise (any activity that increases your heart rate and causes you to sweat) each week. In addition, most adults need muscle-strengthening exercises on 2 or more days a week.   Maintain a healthy weight. The body mass index (BMI) is a screening tool to identify possible weight problems. It provides an estimate of body fat based on height and weight. Your caregiver can help determine your BMI, and can help you achieve or maintain a healthy weight.For adults 20 years and older:   A BMI below 18.5 is considered underweight.   A BMI of 18.5 to 24.9 is normal.   A BMI of 25 to 29.9 is considered overweight.   A BMI of 30 and above is  considered obese.   Maintain normal blood lipids and cholesterol levels by exercising and minimizing your intake of saturated fat. Eat a balanced diet with plenty of fruit and vegetables. Blood tests for lipids and cholesterol should begin at age 20 and be repeated every 5 years. If your lipid or cholesterol levels are high, you are over 50, or you are at high risk for heart disease, you may need your cholesterol levels checked more frequently.Ongoing high lipid and cholesterol levels should be treated with medicines if diet and exercise are not effective.   If you smoke, find out from your caregiver how to quit. If you do not use tobacco, do not start.   If you are pregnant, do not drink alcohol. If you are breastfeeding, be very cautious about drinking alcohol. If you are not pregnant and choose to drink alcohol, do not exceed 1 drink per day. One drink is considered to be 12 ounces (355 mL) of beer, 5 ounces (148 mL) of wine, or 1.5 ounces (44 mL) of liquor.   Avoid use of street drugs. Do not share needles with anyone. Ask for help if you need support or instructions about stopping the use of drugs.   High blood pressure causes heart disease and increases the risk of stroke. Your blood pressure should be checked at least every 1 to 2 years. Ongoing high blood pressure should be treated with medicines if weight loss and exercise are not effective.   If you are 55 to 39   years old, ask your caregiver if you should take aspirin to prevent strokes.   Diabetes screening involves taking a blood sample to check your fasting blood sugar level. This should be done once every 3 years, after age 45, if you are within normal weight and without risk factors for diabetes. Testing should be considered at a younger age or be carried out more frequently if you are overweight and have at least 1 risk factor for diabetes.   Breast cancer screening is essential preventive care for women. You should practice "breast  self-awareness." This means understanding the normal appearance and feel of your breasts and may include breast self-examination. Any changes detected, no matter how small, should be reported to a caregiver. Women in their 20s and 30s should have a clinical breast exam (CBE) by a caregiver as part of a regular health exam every 1 to 3 years. After age 40, women should have a CBE every year. Starting at age 40, women should consider having a mammography (breast X-ray test) every year. Women who have a family history of breast cancer should talk to their caregiver about genetic screening. Women at a high risk of breast cancer should talk to their caregivers about having magnetic resonance imaging (MRI) and a mammography every year.   The Pap test is a screening test for cervical cancer. A Pap test can show cell changes on the cervix that might become cervical cancer if left untreated. A Pap test is a procedure in which cells are obtained and examined from the lower end of the uterus (cervix).   Women should have a Pap test starting at age 21.   Between ages 21 and 29, Pap tests should be repeated every 2 years.   Beginning at age 30, you should have a Pap test every 3 years as long as the past 3 Pap tests have been normal.   Some women have medical problems that increase the chance of getting cervical cancer. Talk to your caregiver about these problems. It is especially important to talk to your caregiver if a new problem develops soon after your last Pap test. In these cases, your caregiver may recommend more frequent screening and Pap tests.   The above recommendations are the same for women who have or have not gotten the vaccine for human papillomavirus (HPV).   If you had a hysterectomy for a problem that was not cancer or a condition that could lead to cancer, then you no longer need Pap tests. Even if you no longer need a Pap test, a regular exam is a good idea to make sure no other problems are  starting.   If you are between ages 65 and 70, and you have had normal Pap tests going back 10 years, you no longer need Pap tests. Even if you no longer need a Pap test, a regular exam is a good idea to make sure no other problems are starting.   If you have had past treatment for cervical cancer or a condition that could lead to cancer, you need Pap tests and screening for cancer for at least 20 years after your treatment.   If Pap tests have been discontinued, risk factors (such as a new sexual partner) need to be reassessed to determine if screening should be resumed.   The HPV test is an additional test that may be used for cervical cancer screening. The HPV test looks for the virus that can cause the cell changes on the cervix.   The cells collected during the Pap test can be tested for HPV. The HPV test could be used to screen women aged 30 years and older, and should be used in women of any age who have unclear Pap test results. After the age of 30, women should have HPV testing at the same frequency as a Pap test.   Colorectal cancer can be detected and often prevented. Most routine colorectal cancer screening begins at the age of 50 and continues through age 75. However, your caregiver may recommend screening at an earlier age if you have risk factors for colon cancer. On a yearly basis, your caregiver may provide home test kits to check for hidden blood in the stool. Use of a small camera at the end of a tube, to directly examine the colon (sigmoidoscopy or colonoscopy), can detect the earliest forms of colorectal cancer. Talk to your caregiver about this at age 50, when routine screening begins. Direct examination of the colon should be repeated every 5 to 10 years through age 75, unless early forms of pre-cancerous polyps or small growths are found.   Hepatitis C blood testing is recommended for all people born from 1945 through 1965 and any individual with known risks for hepatitis C.    Practice safe sex. Use condoms and avoid high-risk sexual practices to reduce the spread of sexually transmitted infections (STIs). STIs include gonorrhea, chlamydia, syphilis, trichomonas, herpes, HPV, and human immunodeficiency virus (HIV). Herpes, HIV, and HPV are viral illnesses that have no cure. They can result in disability, cancer, and death. Sexually active women aged 25 and younger should be checked for chlamydia. Older women with new or multiple partners should also be tested for chlamydia. Testing for other STIs is recommended if you are sexually active and at increased risk.   Osteoporosis is a disease in which the bones lose minerals and strength with aging. This can result in serious bone fractures. The risk of osteoporosis can be identified using a bone density scan. Women ages 65 and over and women at risk for fractures or osteoporosis should discuss screening with their caregivers. Ask your caregiver whether you should take a calcium supplement or vitamin D to reduce the rate of osteoporosis.   Menopause can be associated with physical symptoms and risks. Hormone replacement therapy is available to decrease symptoms and risks. You should talk to your caregiver about whether hormone replacement therapy is right for you.   Use sunscreen with sun protection factor (SPF) of 30 or more. Apply sunscreen liberally and repeatedly throughout the day. You should seek shade when your shadow is shorter than you. Protect yourself by wearing long sleeves, pants, a wide-brimmed hat, and sunglasses year round, whenever you are outdoors.   Once a month, do a whole body skin exam, using a mirror to look at the skin on your back. Notify your caregiver of new moles, moles that have irregular borders, moles that are larger than a pencil eraser, or moles that have changed in shape or color.   Stay current with required immunizations.   Influenza. You need a dose every fall (or winter). The composition of  the flu vaccine changes each year, so being vaccinated once is not enough.   Pneumococcal polysaccharide. You need 1 to 2 doses if you smoke cigarettes or if you have certain chronic medical conditions. You need 1 dose at age 65 (or older) if you have never been vaccinated.   Tetanus, diphtheria, pertussis (Tdap, Td). Get 1 dose of   Tdap vaccine if you are younger than age 65, are over 65 and have contact with an infant, are a healthcare worker, are pregnant, or simply want to be protected from whooping cough. After that, you need a Td booster dose every 10 years. Consult your caregiver if you have not had at least 3 tetanus and diphtheria-containing shots sometime in your life or have a deep or dirty wound.   HPV. You need this vaccine if you are a woman age 26 or younger. The vaccine is given in 3 doses over 6 months.   Measles, mumps, rubella (MMR). You need at least 1 dose of MMR if you were born in 1957 or later. You may also need a second dose.   Meningococcal. If you are age 19 to 21 and a first-year college student living in a residence hall, or have one of several medical conditions, you need to get vaccinated against meningococcal disease. You may also need additional booster doses.   Zoster (shingles). If you are age 60 or older, you should get this vaccine.   Varicella (chickenpox). If you have never had chickenpox or you were vaccinated but received only 1 dose, talk to your caregiver to find out if you need this vaccine.   Hepatitis A. You need this vaccine if you have a specific risk factor for hepatitis A virus infection or you simply wish to be protected from this disease. The vaccine is usually given as 2 doses, 6 to 18 months apart.   Hepatitis B. You need this vaccine if you have a specific risk factor for hepatitis B virus infection or you simply wish to be protected from this disease. The vaccine is given in 3 doses, usually over 6 months.  Preventive Services /  Frequency Ages 19 to 39  Blood pressure check.** / Every 1 to 2 years.   Lipid and cholesterol check.** / Every 5 years beginning at age 20.   Clinical breast exam.** / Every 3 years for women in their 20s and 30s.   Pap test.** / Every 2 years from ages 21 through 29. Every 3 years starting at age 30 through age 65 or 70 with a history of 3 consecutive normal Pap tests.   HPV screening.** / Every 3 years from ages 30 through ages 65 to 70 with a history of 3 consecutive normal Pap tests.   Hepatitis C blood test.** / For any individual with known risks for hepatitis C.   Skin self-exam. / Monthly.   Influenza immunization.** / Every year.   Pneumococcal polysaccharide immunization.** / 1 to 2 doses if you smoke cigarettes or if you have certain chronic medical conditions.   Tetanus, diphtheria, pertussis (Tdap, Td) immunization. / A one-time dose of Tdap vaccine. After that, you need a Td booster dose every 10 years.   HPV immunization. / 3 doses over 6 months, if you are 26 and younger.   Measles, mumps, rubella (MMR) immunization. / You need at least 1 dose of MMR if you were born in 1957 or later. You may also need a second dose.   Meningococcal immunization. / 1 dose if you are age 19 to 21 and a first-year college student living in a residence hall, or have one of several medical conditions, you need to get vaccinated against meningococcal disease. You may also need additional booster doses.   Varicella immunization.** / Consult your caregiver.   Hepatitis A immunization.** / Consult your caregiver. 2 doses, 6 to 18 months   apart.   Hepatitis B immunization.** / Consult your caregiver. 3 doses usually over 6 months.  Ages 40 to 64  Blood pressure check.** / Every 1 to 2 years.   Lipid and cholesterol check.** / Every 5 years beginning at age 20.   Clinical breast exam.** / Every year after age 40.   Mammogram.** / Every year beginning at age 40 and continuing for as  long as you are in good health. Consult with your caregiver.   Pap test.** / Every 3 years starting at age 30 through age 65 or 70 with a history of 3 consecutive normal Pap tests.   HPV screening.** / Every 3 years from ages 30 through ages 65 to 70 with a history of 3 consecutive normal Pap tests.   Fecal occult blood test (FOBT) of stool. / Every year beginning at age 50 and continuing until age 75. You may not need to do this test if you get a colonoscopy every 10 years.   Flexible sigmoidoscopy or colonoscopy.** / Every 5 years for a flexible sigmoidoscopy or every 10 years for a colonoscopy beginning at age 50 and continuing until age 75.   Hepatitis C blood test.** / For all people born from 1945 through 1965 and any individual with known risks for hepatitis C.   Skin self-exam. / Monthly.   Influenza immunization.** / Every year.   Pneumococcal polysaccharide immunization.** / 1 to 2 doses if you smoke cigarettes or if you have certain chronic medical conditions.   Tetanus, diphtheria, pertussis (Tdap, Td) immunization.** / A one-time dose of Tdap vaccine. After that, you need a Td booster dose every 10 years.   Measles, mumps, rubella (MMR) immunization. / You need at least 1 dose of MMR if you were born in 1957 or later. You may also need a second dose.   Varicella immunization.** / Consult your caregiver.   Meningococcal immunization.** / Consult your caregiver.   Hepatitis A immunization.** / Consult your caregiver. 2 doses, 6 to 18 months apart.   Hepatitis B immunization.** / Consult your caregiver. 3 doses, usually over 6 months.  Ages 65 and over  Blood pressure check.** / Every 1 to 2 years.   Lipid and cholesterol check.** / Every 5 years beginning at age 20.   Clinical breast exam.** / Every year after age 40.   Mammogram.** / Every year beginning at age 40 and continuing for as long as you are in good health. Consult with your caregiver.   Pap test.** /  Every 3 years starting at age 30 through age 65 or 70 with a 3 consecutive normal Pap tests. Testing can be stopped between 65 and 70 with 3 consecutive normal Pap tests and no abnormal Pap or HPV tests in the past 10 years.   HPV screening.** / Every 3 years from ages 30 through ages 65 or 70 with a history of 3 consecutive normal Pap tests. Testing can be stopped between 65 and 70 with 3 consecutive normal Pap tests and no abnormal Pap or HPV tests in the past 10 years.   Fecal occult blood test (FOBT) of stool. / Every year beginning at age 50 and continuing until age 75. You may not need to do this test if you get a colonoscopy every 10 years.   Flexible sigmoidoscopy or colonoscopy.** / Every 5 years for a flexible sigmoidoscopy or every 10 years for a colonoscopy beginning at age 50 and continuing until age 75.   Hepatitis   C blood test.** / For all people born from 1945 through 1965 and any individual with known risks for hepatitis C.   Osteoporosis screening.** / A one-time screening for women ages 65 and over and women at risk for fractures or osteoporosis.   Skin self-exam. / Monthly.   Influenza immunization.** / Every year.   Pneumococcal polysaccharide immunization.** / 1 dose at age 65 (or older) if you have never been vaccinated.   Tetanus, diphtheria, pertussis (Tdap, Td) immunization. / A one-time dose of Tdap vaccine if you are over 65 and have contact with an infant, are a healthcare worker, or simply want to be protected from whooping cough. After that, you need a Td booster dose every 10 years.   Varicella immunization.** / Consult your caregiver.   Meningococcal immunization.** / Consult your caregiver.   Hepatitis A immunization.** / Consult your caregiver. 2 doses, 6 to 18 months apart.   Hepatitis B immunization.** / Check with your caregiver. 3 doses, usually over 6 months.  ** Family history and personal history of risk and conditions may change your caregiver's  recommendations. Document Released: 03/05/2001 Document Revised: 12/27/2010 Document Reviewed: 06/04/2010 [employer] Patient Information 2012 [employer]. 

## 2011-11-09 ENCOUNTER — Other Ambulatory Visit: Payer: Self-pay | Admitting: Family Medicine

## 2011-11-09 DIAGNOSIS — E039 Hypothyroidism, unspecified: Secondary | ICD-10-CM

## 2011-11-11 MED ORDER — LEVOTHYROXINE SODIUM 88 MCG PO TABS: 88.0000 ug | ORAL_TABLET | Freq: Every day | ORAL | Status: DC

## 2011-12-06 ENCOUNTER — Encounter: Payer: Self-pay | Admitting: Family Medicine

## 2011-12-06 ENCOUNTER — Telehealth: Payer: Self-pay | Admitting: Family Medicine

## 2011-12-06 ENCOUNTER — Ambulatory Visit (INDEPENDENT_AMBULATORY_CARE_PROVIDER_SITE_OTHER): Payer: Managed Care, Other (non HMO) | Admitting: Family Medicine

## 2011-12-06 VITALS — BP 100/70 | HR 64 | Temp 98.5°F | Wt 210.0 lb

## 2011-12-06 DIAGNOSIS — R0789 Other chest pain: Secondary | ICD-10-CM

## 2011-12-06 DIAGNOSIS — J9801 Acute bronchospasm: Secondary | ICD-10-CM

## 2011-12-06 DIAGNOSIS — R05 Cough: Secondary | ICD-10-CM

## 2011-12-06 MED ORDER — ALBUTEROL SULFATE (2.5 MG/3ML) 0.083% IN NEBU
2.5000 mg | INHALATION_SOLUTION | Freq: Once | RESPIRATORY_TRACT | Status: AC
  Administered 2011-12-06: 2.5 mg via RESPIRATORY_TRACT

## 2011-12-06 NOTE — Telephone Encounter (Signed)
Would need appointment if no better

## 2011-12-06 NOTE — Telephone Encounter (Signed)
Seen today by the DOD.    KP

## 2011-12-06 NOTE — Assessment & Plan Note (Signed)
New.  Likely post infectious.  Cough and wheezing improved w/ neb.  Start Qvar for 1-2 weeks.  Albuterol prn.  Reviewed supportive care and red flags that should prompt return.  Pt expressed understanding and is in agreement w/ plan.

## 2011-12-06 NOTE — Telephone Encounter (Signed)
Patient Information:  Caller Name: Tamee  Phone: [phone]  Patient: Leah Ingram, Leah Ingram  Gender: Female  DOB: 10-01-1970  Age: 41 Years  PCP: Lelon Perla.  Pregnant: No   Symptoms  Reason For Call & Symptoms: ongoing cough  Reviewed Health History In EMR: Yes  Reviewed Medications In EMR: Yes  Reviewed Allergies In EMR: Yes  Date of Onset of Symptoms: 10/31/2011  Treatments Tried: z-pac; Prednisone  Treatments Tried Worked: No OB:  LMP: 12/03/2011  Guideline(s) Used:  Cough  Disposition Per Guideline:   See Today or Tomorrow in Office  Reason For Disposition Reached:   Continuous (nonstop) coughing interferes with work or school and no improvement using cough treatment per Care Advice  Advice Given:  N/A  Office Follow Up:  Does the office need to follow up with this patient?: No  Instructions For The Office: N/A  RN Note:  Pt has had cough x 4 weeks. Seen at [facility] 2 weeks ago. No improvement.

## 2011-12-06 NOTE — Progress Notes (Signed)
  Subjective:    Patient ID: Leah Ingram, female    DOB: 09-12-1970, 41 y.o.   MRN: [id number]  HPI Cough- sxs started 1 month ago.  Initially thought it was a cold, took OTC meds.  10 days ago, went to UC, got Zpack, prednisone, codeine cough syrup, and albuterol inhaler.  Started feeling better but cough continued.  Did not have CXR done.  Cough is wet but not productive.  No fevers.  + sick contacts.  Pt is otherwise feeling well.  No ear pain, some maxillary facial pain.   Review of Systems For ROS see HPI     Objective:   Physical Exam  Vitals reviewed. Constitutional: She appears well-developed and well-nourished. No distress.  HENT:  Head: Normocephalic and atraumatic.       TMs normal bilaterally Mild nasal congestion Throat w/out erythema, edema, or exudate  Eyes: Conjunctivae normal and EOM are normal. Pupils are equal, round, and reactive to light.  Neck: Normal range of motion. Neck supple.  Cardiovascular: Normal rate, regular rhythm, normal heart sounds and intact distal pulses.   No murmur heard. Pulmonary/Chest: Effort normal. No respiratory distress. She has wheezes (faint wheezing diffusely, cleared s/p neb tx).       + hacking cough  Lymphadenopathy:    She has no cervical adenopathy.          Assessment & Plan:

## 2011-12-06 NOTE — Patient Instructions (Addendum)
This is a post-infectious cough due to airway inflammation Start the Qvar- 1 puff twice daily for 1-2 weeks (or feeling better) Use the albuterol inhaler- 2 puffs every 4 hrs as needed for cough or chest tightness Ibuprofen also helps w/ airway inflammation Drink plenty of fluids REST if able! Hang in there!!

## 2012-03-08 ENCOUNTER — Other Ambulatory Visit: Payer: Self-pay

## 2012-03-28 IMAGING — MG MM DIGITAL DIAGNOSTIC BILAT {BCG}
6 series · 6 of 6 positions shown · non-contrast
Comparison: None.

CLINICAL DATA: The patient states that when she places her left
arm next to her left breast, she feels as if "something is poking
out."  She states that there is no mass.

DIGITAL DIAGNOSTIC BILATERAL MAMMOGRAM WITH CAD

[R CC (1 of 2)]
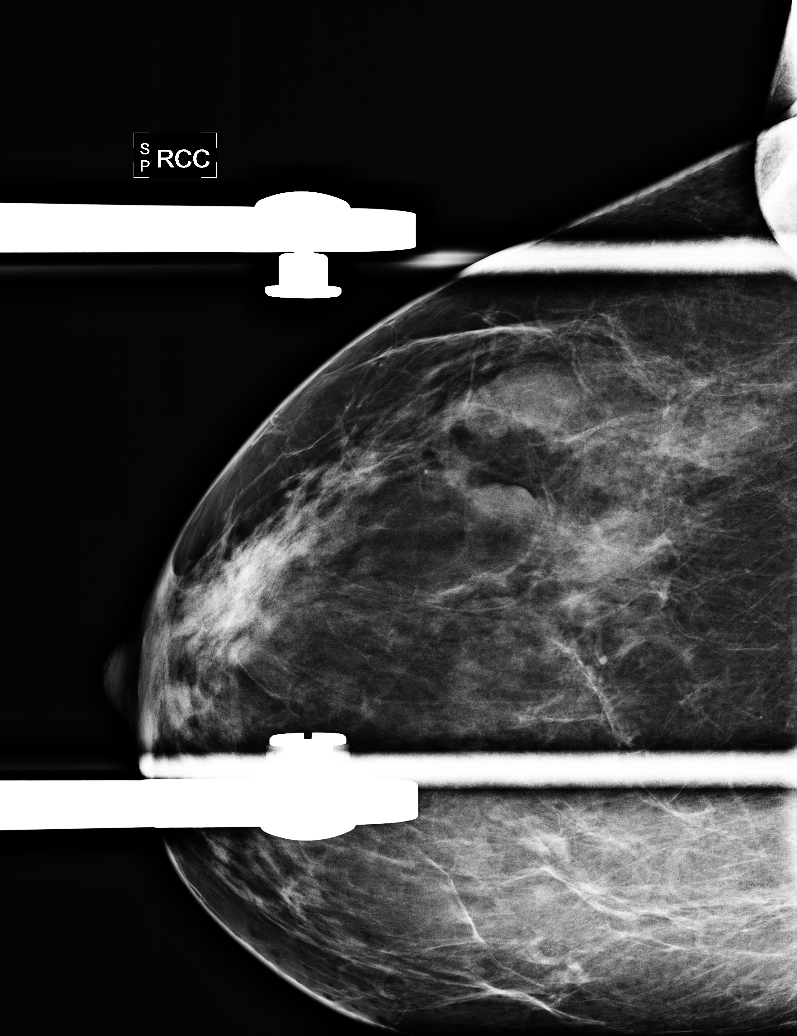

[R CC (2 of 2)]
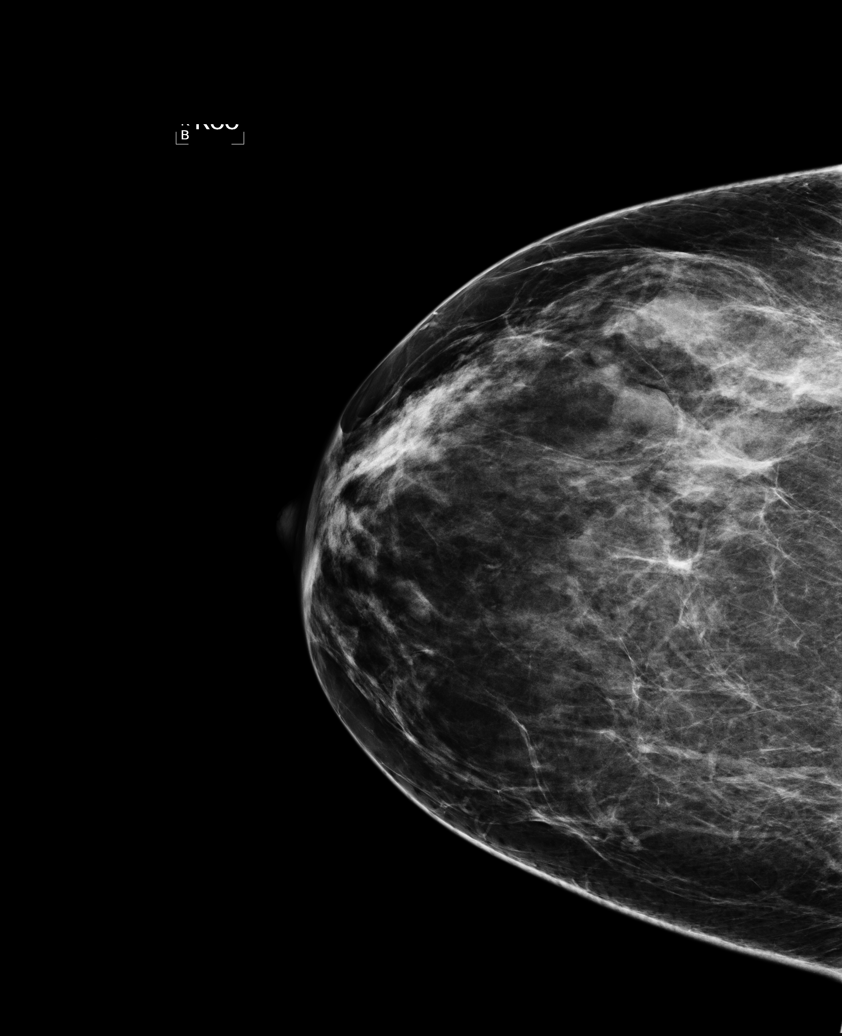

[L CC]
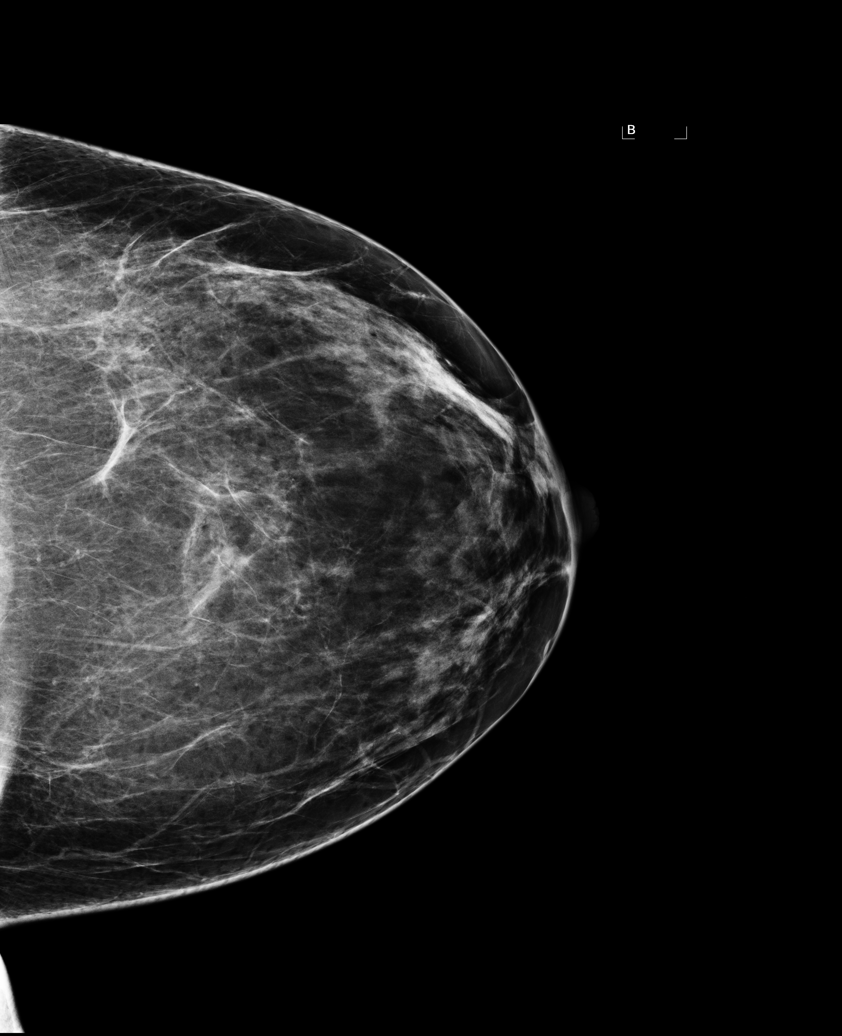

[L MLO]
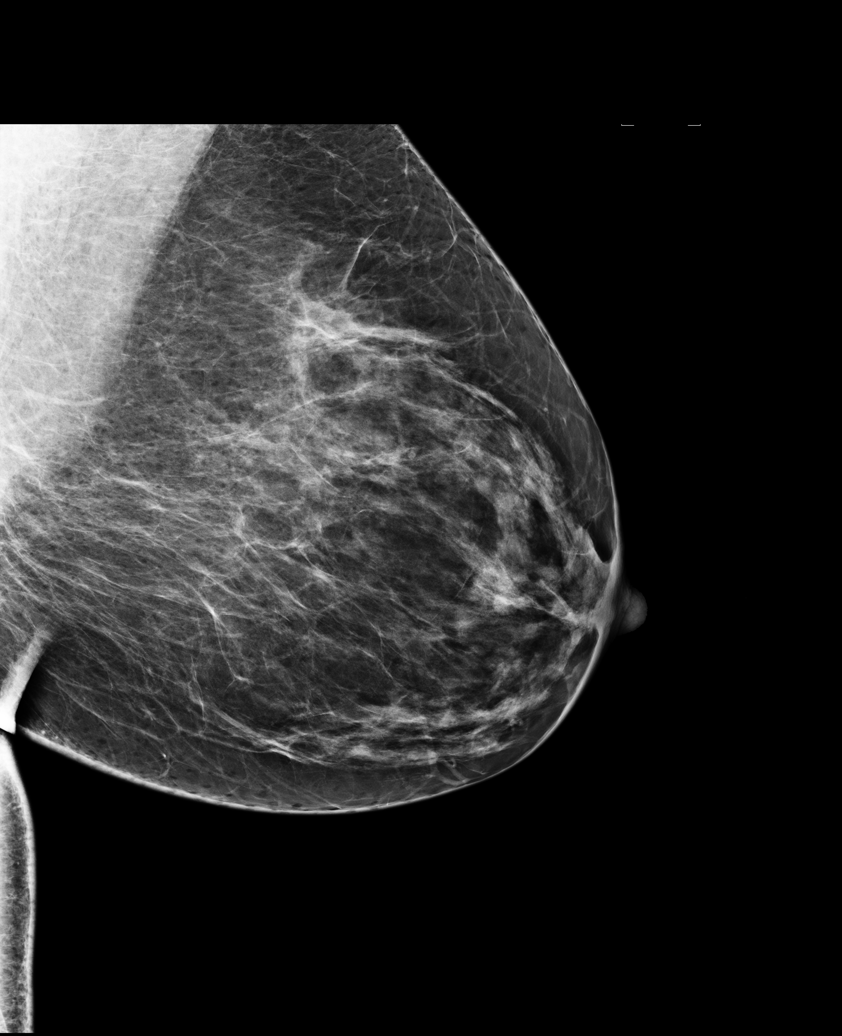

[R MLO (1 of 2)]
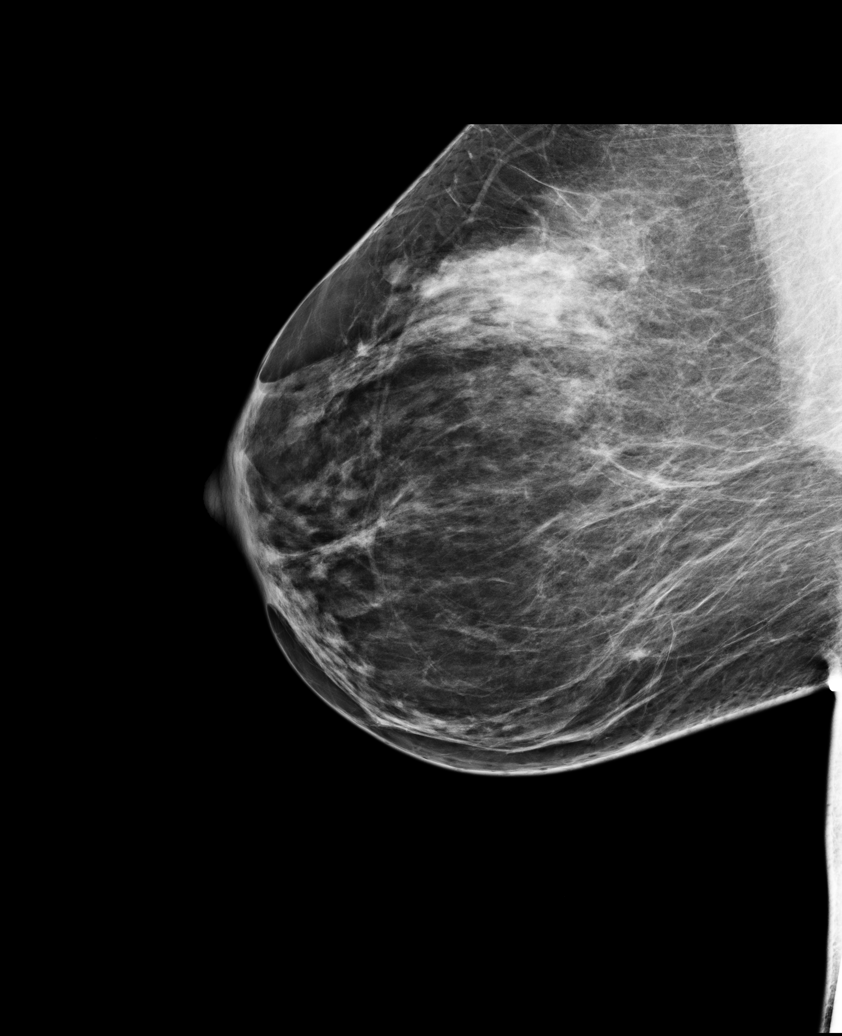

[R MLO (2 of 2)]
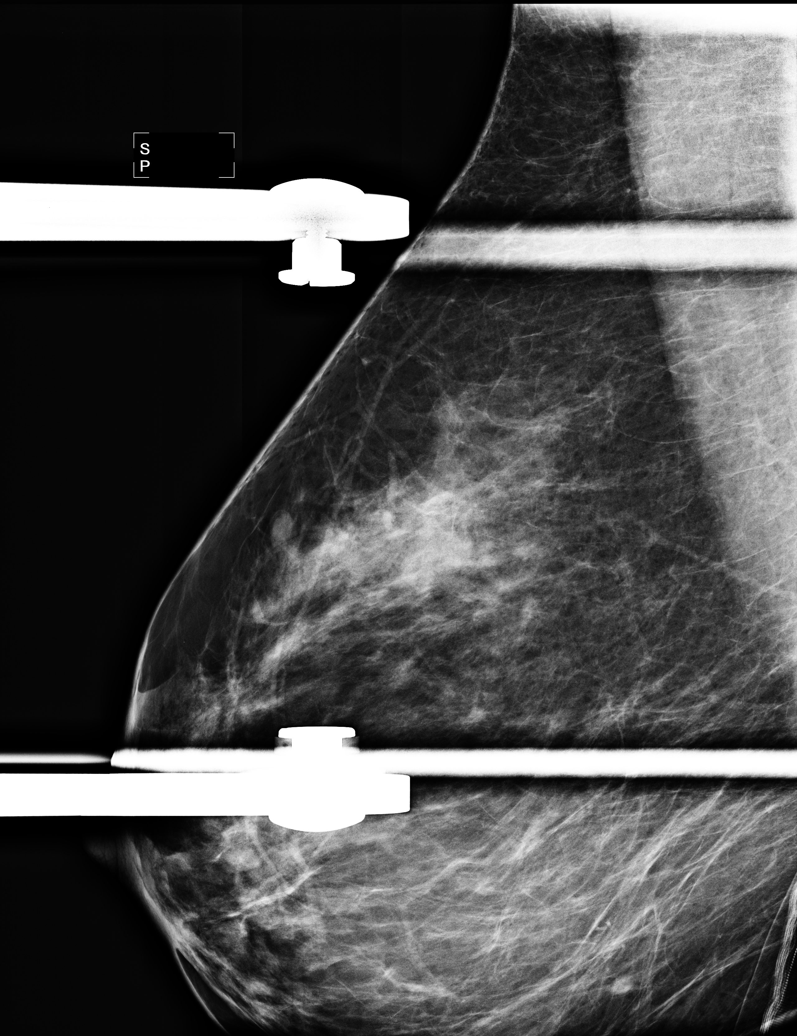

[6 of 6 positions shown; findings below may reference images not displayed]

FINDINGS: There are scattered fibroglandular densities.  There is
no suspicious dominant mass, architectural distortion or
calcification to suggest malignancy.  There is an oval mixed fatty
and soft tissue density lesion in the upper outer quadrant of the
right breast which is consistent with a benign
hamartoma/fibroadenolipoma.
Mammographic images were processed with CAD.
IMPRESSION: No mammographic evidence of malignancy.  Yearly screening
mammography should begin at age 40 unless clinically indicated
earlier.

BI-RADS CATEGORY 2:  Benign finding(s).

## 2012-04-22 ENCOUNTER — Encounter: Payer: Self-pay | Admitting: Family Medicine

## 2012-04-22 ENCOUNTER — Ambulatory Visit (INDEPENDENT_AMBULATORY_CARE_PROVIDER_SITE_OTHER): Payer: Managed Care, Other (non HMO) | Admitting: Family Medicine

## 2012-04-22 VITALS — BP 100/68 | HR 81 | Temp 99.4°F | Wt 209.6 lb

## 2012-04-22 DIAGNOSIS — N912 Amenorrhea, unspecified: Secondary | ICD-10-CM

## 2012-04-22 NOTE — Patient Instructions (Signed)

## 2012-04-22 NOTE — Progress Notes (Signed)
  Subjective:    Patient ID: Leah Ingram, female    DOB: Oct 08, 1970, 42 y.o.   MRN: [id number]  HPI Pt here with her husband to verify her pregnancy.  She had a + preg test at home Saturday. FDLMP 03/16/2012  Review of Systems As above    Objective:   Physical Exam  BP 100/68  Pulse 81  Temp(Src) 99.4 F (37.4 C) (Oral)  Wt 209 lb 9.6 oz (95.074 kg)  BMI 30.73 kg/m2  SpO2 97%  LMP 03/16/2012 General appearance: alert, cooperative, appears stated age and no distress      Assessment & Plan:  Pregnancy--  EGA [redacted] weeks and 1 day                      edc -12/21/12                  pnv ---she is already taking                   F/u OB

## 2012-05-20 ENCOUNTER — Other Ambulatory Visit: Payer: Self-pay | Admitting: General Practice

## 2012-05-20 DIAGNOSIS — E039 Hypothyroidism, unspecified: Secondary | ICD-10-CM

## 2012-05-20 MED ORDER — LEVOTHYROXINE SODIUM 88 MCG PO TABS: 88.0000 ug | ORAL_TABLET | Freq: Every day | ORAL | Status: DC

## 2012-05-20 NOTE — Telephone Encounter (Signed)
Med filled.  

## 2012-06-02 ENCOUNTER — Telehealth: Payer: Self-pay | Admitting: General Practice

## 2012-06-02 DIAGNOSIS — E039 Hypothyroidism, unspecified: Secondary | ICD-10-CM

## 2012-06-02 MED ORDER — LEVOTHYROXINE SODIUM 88 MCG PO TABS: 88.0000 ug | ORAL_TABLET | Freq: Every day | ORAL | Status: DC

## 2012-06-02 NOTE — Telephone Encounter (Signed)
Pt called stating that her new pharmacy [facility] has received her Synthroid request, however it will be two weeks before they can process. Asked pt to contact us to send an emergency supply to her local pharmacy [facility], [facility]. Med sent today, pt notified.

## 2012-10-15 ENCOUNTER — Telehealth: Payer: Self-pay

## 2012-10-15 NOTE — Telephone Encounter (Signed)
LM for [facility] HM UTD WE: Flu vaccine, mammograms will be due starting in October 2014

## 2012-10-16 ENCOUNTER — Other Ambulatory Visit (HOSPITAL_COMMUNITY)
Admission: RE | Admit: 2012-10-16 | Discharge: 2012-10-16 | Disposition: A | Discharge status: Home / Self Care | Payer: Managed Care, Other (non HMO) | Source: Ambulatory Visit | Attending: Family Medicine | Admitting: Family Medicine

## 2012-10-16 ENCOUNTER — Ambulatory Visit (INDEPENDENT_AMBULATORY_CARE_PROVIDER_SITE_OTHER): Payer: Managed Care, Other (non HMO) | Admitting: Family Medicine

## 2012-10-16 ENCOUNTER — Encounter: Payer: Self-pay | Admitting: Family Medicine

## 2012-10-16 VITALS — BP 114/78 | HR 83 | Temp 98.5°F | Ht 69.5 in | Wt 210.0 lb

## 2012-10-16 DIAGNOSIS — E039 Hypothyroidism, unspecified: Secondary | ICD-10-CM

## 2012-10-16 DIAGNOSIS — Z1151 Encounter for screening for human papillomavirus (HPV): Secondary | ICD-10-CM | POA: Insufficient documentation

## 2012-10-16 DIAGNOSIS — Z1211 Encounter for screening for malignant neoplasm of colon: Secondary | ICD-10-CM

## 2012-10-16 DIAGNOSIS — Z0000 Encounter for general adult medical examination without abnormal findings: Secondary | ICD-10-CM

## 2012-10-16 DIAGNOSIS — Z01419 Encounter for gynecological examination (general) (routine) without abnormal findings: Secondary | ICD-10-CM | POA: Insufficient documentation

## 2012-10-16 DIAGNOSIS — Z23 Encounter for immunization: Secondary | ICD-10-CM

## 2012-10-16 DIAGNOSIS — Z124 Encounter for screening for malignant neoplasm of cervix: Secondary | ICD-10-CM

## 2012-10-16 DIAGNOSIS — E669 Obesity, unspecified: Secondary | ICD-10-CM | POA: Insufficient documentation

## 2012-10-16 LAB — HEMOCCULT GUIAC POC 1CARD (OFFICE): Fecal Occult Blood, POC: POSITIVE

## 2012-10-16 LAB — BASIC METABOLIC PANEL
BUN: 9 mg/dL (ref 6–23)
CO2: 27 mEq/L (ref 19–32)
Calcium: 9.3 mg/dL (ref 8.4–10.5)
Chloride: 104 mEq/L (ref 96–112)
Creatinine, Ser: 0.9 mg/dL (ref 0.4–1.2)
GFR: 95.3 mL/min (ref >60.00)
Glucose, Bld: 86 mg/dL (ref 70–99)
Potassium: 4 mEq/L (ref 3.5–5.1)
Sodium: 137 mEq/L (ref 135–145)

## 2012-10-16 LAB — LIPID PANEL
Cholesterol: 145 mg/dL (ref 0–200)
HDL: 43 mg/dL (ref >39.00)
LDL Cholesterol: 94 mg/dL (ref 0–99)
Total CHOL/HDL Ratio: 3
Triglycerides: 42 mg/dL (ref 0.0–149.0)
VLDL: 8.4 mg/dL (ref 0.0–40.0)

## 2012-10-16 LAB — CBC WITH DIFFERENTIAL/PLATELET
Basophils Absolute: 0 10*3/uL (ref 0.0–0.1)
Basophils Relative: 0.5 % (ref 0.0–3.0)
Eosinophils Absolute: 0.1 10*3/uL (ref 0.0–0.7)
Eosinophils Relative: 3.4 % (ref 0.0–5.0)
HCT: 36.3 % (ref 36.0–46.0)
Hemoglobin: 12.2 g/dL (ref 12.0–15.0)
Lymphocytes Relative: 47.8 % — ABNORMAL HIGH (ref 12.0–46.0)
Lymphs Abs: 1.7 10*3/uL (ref 0.7–4.0)
MCHC: 33.7 g/dL (ref 30.0–36.0)
MCV: 81.7 fl (ref 78.0–100.0)
Monocytes Absolute: 0.3 10*3/uL (ref 0.1–1.0)
Monocytes Relative: 7.6 % (ref 3.0–12.0)
Neutro Abs: 1.4 10*3/uL (ref 1.4–7.7)
Neutrophils Relative %: 40.7 % — ABNORMAL LOW (ref 43.0–77.0)
Platelets: 186 10*3/uL (ref 150.0–400.0)
RBC: 4.44 Mil/uL (ref 3.87–5.11)
RDW: 15.3 % — ABNORMAL HIGH (ref 11.5–14.6)
WBC: 3.6 10*3/uL — ABNORMAL LOW (ref 4.5–10.5)

## 2012-10-16 LAB — HEPATIC FUNCTION PANEL
ALT: 13 U/L (ref 0–35)
AST: 16 U/L (ref 0–37)
Albumin: 3.8 g/dL (ref 3.5–5.2)
Alkaline Phosphatase: 82 U/L (ref 39–117)
Bilirubin, Direct: 0 mg/dL (ref 0.0–0.3)
Total Bilirubin: 0.5 mg/dL (ref 0.3–1.2)
Total Protein: 7.8 g/dL (ref 6.0–8.3)

## 2012-10-16 LAB — TSH: TSH: 0.63 u[IU]/mL (ref 0.35–5.50)

## 2012-10-16 NOTE — Telephone Encounter (Signed)
Unable to reach pre visit.  

## 2012-10-16 NOTE — Progress Notes (Signed)
Subjective:     Leah Ingram is a 42 y.o. female and is here for a comprehensive physical exam. The patient reports no problems.  History   Social History  . Marital Status: Married    Spouse Name: N/A    Number of Children: N/A  . 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 Smoking status: Never Smoker   . Smokeless tobacco: Never Used  . Alcohol Use: Yes     Comment: very rare  . Drug Use: No  . Sexual Activity: Yes    Partners: Male   Other Topics Concern  . Not on file   Social History Narrative   Exercise--  Just started fitness pal   Health Maintenance  Topic Date Due  . Mammogram  03/15/2011  . Influenza Vaccine  08/21/2013  . Pap Smear  10/17/2015  . Tetanus/tdap  07/07/2019    The following portions of the patient's history were reviewed and updated as appropriate:  She  has a past medical history of Thyroid disease. She  does not have any pertinent problems on file. She  has past surgical history that includes Foot surgery (2002). Her family history includes Arrhythmia in an other family member; COPD in her mother; Cancer in her mother; Diabetes in her maternal grandmother; Heart disease in her maternal grandmother; Hypertension in her maternal grandmother. She  reports that she has never smoked. She has never used smokeless tobacco. She reports that  drinks alcohol. She reports that she does not use illicit drugs. She has a current medication list which includes the following prescription(s): levothyroxine and prenatal multivitamin. Current Outpatient Prescriptions on File Prior to Visit  Medication Sig Dispense Refill  . levothyroxine (SYNTHROID, LEVOTHROID) 88 MCG tablet Take 1 tablet (88 mcg total) by mouth daily.  14 tablet  0  . Prenatal Vit-Fe Fumarate-FA (PRENATAL MULTIVITAMIN) 60-1 MG tablet Take 1 tablet by mouth daily.         No current facility-administered medications on file prior to  visit.   She is allergic to penicillins..  Review of Systems Review of Systems  Constitutional: Negative for activity change, appetite change and fatigue.  HENT: Negative for hearing loss, congestion, tinnitus and ear discharge.  dentist q71m Eyes: Negative for visual disturbance (see optho q1y -- vision corrected to 20/20 with glasses).  Respiratory: Negative for cough, chest tightness and shortness of breath.   Cardiovascular: Negative for chest pain, palpitations and leg swelling.  Gastrointestinal: Negative for abdominal pain, diarrhea, constipation and abdominal distention.  Genitourinary: Negative for urgency, frequency, decreased urine volume and difficulty urinating.  Musculoskeletal: Negative for back pain, arthralgias and gait problem.  Skin: Negative for color change, pallor and rash.  Neurological: Negative for dizziness, light-headedness, numbness and headaches.  Hematological: Negative for adenopathy. Does not bruise/bleed easily.  Psychiatric/Behavioral: Negative for suicidal ideas, confusion, sleep disturbance, self-injury, dysphoric mood, decreased concentration and agitation.        Objective:    BP 114/78  Pulse 83  Temp(Src) 98.5 F (36.9 C) (Oral)  Ht 5' 9.5" (1.765 m)  Wt 210 lb (95.255 kg)  BMI 30.58 kg/m2  SpO2 99%  LMP 10/09/2012 General appearance: alert, cooperative, appears stated age and no distress Head: Normocephalic, without obvious abnormality, atraumatic Eyes: conjunctivae/corneas clear. PERRL, EOM's intact. Fundi benign. Ears: normal TM's and external ear canals both ears Nose: Nares normal. Septum midline. Mucosa normal. No drainage or sinus tenderness. Throat:  lips, mucosa, and tongue normal; teeth and gums normal Neck: no adenopathy, no carotid bruit, no JVD, supple, symmetrical, trachea midline and thyroid not enlarged, symmetric, no tenderness/mass/nodules Back: symmetric, no curvature. ROM normal. No CVA tenderness. Lungs: clear to  auscultation bilaterally Breasts: normal appearance, no masses or tenderness Heart: regular rate and rhythm, S1, S2 normal, no murmur, click, rub or gallop Abdomen: soft, non-tender; bowel sounds normal; no masses,  no organomegaly Pelvic: cervix normal in appearance, external genitalia normal, no adnexal masses or tenderness, no cervical motion tenderness, rectovaginal septum normal, uterus normal size, shape, and consistency and vagina normal without discharge--pap done Extremities: extremities normal, atraumatic, no cyanosis or edema Pulses: 2+ and symmetric Skin: Skin color, texture, turgor normal. No rashes or lesions Lymph nodes: Cervical, supraclavicular, and axillary nodes normal. Neurologic: Alert and oriented X 3, normal strength and tone. Normal symmetric reflexes. Normal coordination and gait Psych-- no depression, no anxiety      Assessment:    Healthy female exam.      Plan:    check labs Con' t meds See After Visit Summary for Counseling Recommendations

## 2012-10-16 NOTE — Patient Instructions (Addendum)
Preventive Care for Adults, Female A healthy lifestyle and preventive care can promote health and wellness. Preventive health guidelines for women include the following key practices.  A routine yearly physical is a good way to check with your caregiver about your health and preventive screening. It is a chance to share any concerns and updates on your health, and to receive a thorough exam.  Visit your dentist for a routine exam and preventive care every 6 months. Brush your teeth twice a day and floss once a day. Good oral hygiene prevents tooth decay and gum disease.  The frequency of eye exams is based on your age, health, family medical history, use of contact lenses, and other factors. Follow your caregiver's recommendations for frequency of eye exams.  Eat a healthy diet. Foods like vegetables, fruits, whole grains, low-fat dairy products, and lean protein foods contain the nutrients you need without too many calories. Decrease your intake of foods high in solid fats, added sugars, and salt. Eat the right amount of calories for you.Get information about a proper diet from your caregiver, if necessary.  Regular physical exercise is one of the most important things you can do for your health. Most adults should get at least 150 minutes of moderate-intensity exercise (any activity that increases your heart rate and causes you to sweat) each week. In addition, most adults need muscle-strengthening exercises on 2 or more days a week.  Maintain a healthy weight. The body mass index (BMI) is a screening tool to identify possible weight problems. It provides an estimate of body fat based on height and weight. Your caregiver can help determine your BMI, and can help you achieve or maintain a healthy weight.For adults 20 years and older:  A BMI below 18.5 is considered underweight.  A BMI of 18.5 to 24.9 is normal.  A BMI of 25 to 29.9 is considered overweight.  A BMI of 30 and above is  considered obese.  Maintain normal blood lipids and cholesterol levels by exercising and minimizing your intake of saturated fat. Eat a balanced diet with plenty of fruit and vegetables. Blood tests for lipids and cholesterol should begin at age 20 and be repeated every 5 years. If your lipid or cholesterol levels are high, you are over 50, or you are at high risk for heart disease, you may need your cholesterol levels checked more frequently.Ongoing high lipid and cholesterol levels should be treated with medicines if diet and exercise are not effective.  If you smoke, find out from your caregiver how to quit. If you do not use tobacco, do not start.  If you are pregnant, do not drink alcohol. If you are breastfeeding, be very cautious about drinking alcohol. If you are not pregnant and choose to drink alcohol, do not exceed 1 drink per day. One drink is considered to be 12 ounces (355 mL) of beer, 5 ounces (148 mL) of wine, or 1.5 ounces (44 mL) of liquor.  Avoid use of street drugs. Do not share needles with anyone. Ask for help if you need support or instructions about stopping the use of drugs.  High blood pressure causes heart disease and increases the risk of stroke. Your blood pressure should be checked at least every 1 to 2 years. Ongoing high blood pressure should be treated with medicines if weight loss and exercise are not effective.  If you are 55 to 40 years old, ask your caregiver if you should take aspirin to prevent strokes.  Diabetes   screening involves taking a blood sample to check your fasting blood sugar level. This should be done once every 3 years, after age 45, if you are within normal weight and without risk factors for diabetes. Testing should be considered at a younger age or be carried out more frequently if you are overweight and have at least 1 risk factor for diabetes.  Breast cancer screening is essential preventive care for women. You should practice "breast  self-awareness." This means understanding the normal appearance and feel of your breasts and may include breast self-examination. Any changes detected, no matter how small, should be reported to a caregiver. Women in their 20s and 30s should have a clinical breast exam (CBE) by a caregiver as part of a regular health exam every 1 to 3 years. After age 40, women should have a CBE every year. Starting at age 40, women should consider having a mammography (breast X-ray test) every year. Women who have a family history of breast cancer should talk to their caregiver about genetic screening. Women at a high risk of breast cancer should talk to their caregivers about having magnetic resonance imaging (MRI) and a mammography every year.  The Pap test is a screening test for cervical cancer. A Pap test can show cell changes on the cervix that might become cervical cancer if left untreated. A Pap test is a procedure in which cells are obtained and examined from the lower end of the uterus (cervix).  Women should have a Pap test starting at age 21.  Between ages 21 and 29, Pap tests should be repeated every 2 years.  Beginning at age 30, you should have a Pap test every 3 years as long as the past 3 Pap tests have been normal.  Some women have medical problems that increase the chance of getting cervical cancer. Talk to your caregiver about these problems. It is especially important to talk to your caregiver if a new problem develops soon after your last Pap test. In these cases, your caregiver may recommend more frequent screening and Pap tests.  The above recommendations are the same for women who have or have not gotten the vaccine for human papillomavirus (HPV).  If you had a hysterectomy for a problem that was not cancer or a condition that could lead to cancer, then you no longer need Pap tests. Even if you no longer need a Pap test, a regular exam is a good idea to make sure no other problems are  starting.  If you are between ages 65 and 70, and you have had normal Pap tests going back 10 years, you no longer need Pap tests. Even if you no longer need a Pap test, a regular exam is a good idea to make sure no other problems are starting.  If you have had past treatment for cervical cancer or a condition that could lead to cancer, you need Pap tests and screening for cancer for at least 20 years after your treatment.  If Pap tests have been discontinued, risk factors (such as a new sexual partner) need to be reassessed to determine if screening should be resumed.  The HPV test is an additional test that may be used for cervical cancer screening. The HPV test looks for the virus that can cause the cell changes on the cervix. The cells collected during the Pap test can be tested for HPV. The HPV test could be used to screen women aged 30 years and older, and should   be used in women of any age who have unclear Pap test results. After the age of 30, women should have HPV testing at the same frequency as a Pap test.  Colorectal cancer can be detected and often prevented. Most routine colorectal cancer screening begins at the age of 50 and continues through age 75. However, your caregiver may recommend screening at an earlier age if you have risk factors for colon cancer. On a yearly basis, your caregiver may provide home test kits to check for hidden blood in the stool. Use of a small camera at the end of a tube, to directly examine the colon (sigmoidoscopy or colonoscopy), can detect the earliest forms of colorectal cancer. Talk to your caregiver about this at age 50, when routine screening begins. Direct examination of the colon should be repeated every 5 to 10 years through age 75, unless early forms of pre-cancerous polyps or small growths are found.  Hepatitis C blood testing is recommended for all people born from 1945 through 1965 and any individual with known risks for hepatitis C.  Practice  safe sex. Use condoms and avoid high-risk sexual practices to reduce the spread of sexually transmitted infections (STIs). STIs include gonorrhea, chlamydia, syphilis, trichomonas, herpes, HPV, and human immunodeficiency virus (HIV). Herpes, HIV, and HPV are viral illnesses that have no cure. They can result in disability, cancer, and death. Sexually active women aged 25 and younger should be checked for chlamydia. Older women with new or multiple partners should also be tested for chlamydia. Testing for other STIs is recommended if you are sexually active and at increased risk.  Osteoporosis is a disease in which the bones lose minerals and strength with aging. This can result in serious bone fractures. The risk of osteoporosis can be identified using a bone density scan. Women ages 65 and over and women at risk for fractures or osteoporosis should discuss screening with their caregivers. Ask your caregiver whether you should take a calcium supplement or vitamin D to reduce the rate of osteoporosis.  Menopause can be associated with physical symptoms and risks. Hormone replacement therapy is available to decrease symptoms and risks. You should talk to your caregiver about whether hormone replacement therapy is right for you.  Use sunscreen with sun protection factor (SPF) of 30 or more. Apply sunscreen liberally and repeatedly throughout the day. You should seek shade when your shadow is shorter than you. Protect yourself by wearing long sleeves, pants, a wide-brimmed hat, and sunglasses year round, whenever you are outdoors.  Once a month, do a whole body skin exam, using a mirror to look at the skin on your back. Notify your caregiver of new moles, moles that have irregular borders, moles that are larger than a pencil eraser, or moles that have changed in shape or color.  Stay current with required immunizations.  Influenza. You need a dose every fall (or winter). The composition of the flu vaccine  changes each year, so being vaccinated once is not enough.  Pneumococcal polysaccharide. You need 1 to 2 doses if you smoke cigarettes or if you have certain chronic medical conditions. You need 1 dose at age 65 (or older) if you have never been vaccinated.  Tetanus, diphtheria, pertussis (Tdap, Td). Get 1 dose of Tdap vaccine if you are younger than age 65, are over 65 and have contact with an infant, are a healthcare worker, are pregnant, or simply want to be protected from whooping cough. After that, you need a Td   booster dose every 10 years. Consult your caregiver if you have not had at least 3 tetanus and diphtheria-containing shots sometime in your life or have a deep or dirty wound.  HPV. You need this vaccine if you are a woman age 26 or younger. The vaccine is given in 3 doses over 6 months.  Measles, mumps, rubella (MMR). You need at least 1 dose of MMR if you were born in 1957 or later. You may also need a second dose.  Meningococcal. If you are age 19 to 21 and a first-year college student living in a residence hall, or have one of several medical conditions, you need to get vaccinated against meningococcal disease. You may also need additional booster doses.  Zoster (shingles). If you are age 60 or older, you should get this vaccine.  Varicella (chickenpox). If you have never had chickenpox or you were vaccinated but received only 1 dose, talk to your caregiver to find out if you need this vaccine.  Hepatitis A. You need this vaccine if you have a specific risk factor for hepatitis A virus infection or you simply wish to be protected from this disease. The vaccine is usually given as 2 doses, 6 to 18 months apart.  Hepatitis B. You need this vaccine if you have a specific risk factor for hepatitis B virus infection or you simply wish to be protected from this disease. The vaccine is given in 3 doses, usually over 6 months. Preventive Services / Frequency Ages 19 to 39  Blood  pressure check.** / Every 1 to 2 years.  Lipid and cholesterol check.** / Every 5 years beginning at age 20.  Clinical breast exam.** / Every 3 years for women in their 20s and 30s.  Pap test.** / Every 2 years from ages 21 through 29. Every 3 years starting at age 30 through age 65 or 70 with a history of 3 consecutive normal Pap tests.  HPV screening.** / Every 3 years from ages 30 through ages 65 to 70 with a history of 3 consecutive normal Pap tests.  Hepatitis C blood test.** / For any individual with known risks for hepatitis C.  Skin self-exam. / Monthly.  Influenza immunization.** / Every year.  Pneumococcal polysaccharide immunization.** / 1 to 2 doses if you smoke cigarettes or if you have certain chronic medical conditions.  Tetanus, diphtheria, pertussis (Tdap, Td) immunization. / A one-time dose of Tdap vaccine. After that, you need a Td booster dose every 10 years.  HPV immunization. / 3 doses over 6 months, if you are 26 and younger.  Measles, mumps, rubella (MMR) immunization. / You need at least 1 dose of MMR if you were born in 1957 or later. You may also need a second dose.  Meningococcal immunization. / 1 dose if you are age 19 to 21 and a first-year college student living in a residence hall, or have one of several medical conditions, you need to get vaccinated against meningococcal disease. You may also need additional booster doses.  Varicella immunization.** / Consult your caregiver.  Hepatitis A immunization.** / Consult your caregiver. 2 doses, 6 to 18 months apart.  Hepatitis B immunization.** / Consult your caregiver. 3 doses usually over 6 months. Ages 40 to 64  Blood pressure check.** / Every 1 to 2 years.  Lipid and cholesterol check.** / Every 5 years beginning at age 20.  Clinical breast exam.** / Every year after age 40.  Mammogram.** / Every year beginning at age 40   and continuing for as long as you are in good health. Consult with your  caregiver.  Pap test.** / Every 3 years starting at age 30 through age 65 or 70 with a history of 3 consecutive normal Pap tests.  HPV screening.** / Every 3 years from ages 30 through ages 65 to 70 with a history of 3 consecutive normal Pap tests.  Fecal occult blood test (FOBT) of stool. / Every year beginning at age 50 and continuing until age 75. You may not need to do this test if you get a colonoscopy every 10 years.  Flexible sigmoidoscopy or colonoscopy.** / Every 5 years for a flexible sigmoidoscopy or every 10 years for a colonoscopy beginning at age 50 and continuing until age 75.  Hepatitis C blood test.** / For all people born from 1945 through 1965 and any individual with known risks for hepatitis C.  Skin self-exam. / Monthly.  Influenza immunization.** / Every year.  Pneumococcal polysaccharide immunization.** / 1 to 2 doses if you smoke cigarettes or if you have certain chronic medical conditions.  Tetanus, diphtheria, pertussis (Tdap, Td) immunization.** / A one-time dose of Tdap vaccine. After that, you need a Td booster dose every 10 years.  Measles, mumps, rubella (MMR) immunization. / You need at least 1 dose of MMR if you were born in 1957 or later. You may also need a second dose.  Varicella immunization.** / Consult your caregiver.  Meningococcal immunization.** / Consult your caregiver.  Hepatitis A immunization.** / Consult your caregiver. 2 doses, 6 to 18 months apart.  Hepatitis B immunization.** / Consult your caregiver. 3 doses, usually over 6 months. Ages 65 and over  Blood pressure check.** / Every 1 to 2 years.  Lipid and cholesterol check.** / Every 5 years beginning at age 20.  Clinical breast exam.** / Every year after age 40.  Mammogram.** / Every year beginning at age 40 and continuing for as long as you are in good health. Consult with your caregiver.  Pap test.** / Every 3 years starting at age 30 through age 65 or 70 with a 3  consecutive normal Pap tests. Testing can be stopped between 65 and 70 with 3 consecutive normal Pap tests and no abnormal Pap or HPV tests in the past 10 years.  HPV screening.** / Every 3 years from ages 30 through ages 65 or 70 with a history of 3 consecutive normal Pap tests. Testing can be stopped between 65 and 70 with 3 consecutive normal Pap tests and no abnormal Pap or HPV tests in the past 10 years.  Fecal occult blood test (FOBT) of stool. / Every year beginning at age 50 and continuing until age 75. You may not need to do this test if you get a colonoscopy every 10 years.  Flexible sigmoidoscopy or colonoscopy.** / Every 5 years for a flexible sigmoidoscopy or every 10 years for a colonoscopy beginning at age 50 and continuing until age 75.  Hepatitis C blood test.** / For all people born from 1945 through 1965 and any individual with known risks for hepatitis C.  Osteoporosis screening.** / A one-time screening for women ages 65 and over and women at risk for fractures or osteoporosis.  Skin self-exam. / Monthly.  Influenza immunization.** / Every year.  Pneumococcal polysaccharide immunization.** / 1 dose at age 65 (or older) if you have never been vaccinated.  Tetanus, diphtheria, pertussis (Tdap, Td) immunization. / A one-time dose of Tdap vaccine if you are over   65 and have contact with an infant, are a healthcare worker, or simply want to be protected from whooping cough. After that, you need a Td booster dose every 10 years.  Varicella immunization.** / Consult your caregiver.  Meningococcal immunization.** / Consult your caregiver.  Hepatitis A immunization.** / Consult your caregiver. 2 doses, 6 to 18 months apart.  Hepatitis B immunization.** / Check with your caregiver. 3 doses, usually over 6 months. ** Family history and personal history of risk and conditions may change your caregiver's recommendations. Document Released: 03/05/2001 Document Revised: 04/01/2011  Document Reviewed: 06/04/2010 [employer] Patient Information 2014 [employer].  

## 2012-10-16 NOTE — Assessment & Plan Note (Signed)
Check labs con't meds 

## 2012-10-20 LAB — POCT URINALYSIS DIPSTICK
Bilirubin, UA: NEGATIVE
Blood, UA: NEGATIVE
Glucose, UA: NEGATIVE
Ketones, UA: NEGATIVE
Leukocytes, UA: NEGATIVE
Nitrite, UA: NEGATIVE
Protein, UA: NEGATIVE
Spec Grav, UA: 1.015
Urobilinogen, UA: 0.2
pH, UA: 6

## 2012-12-16 ENCOUNTER — Other Ambulatory Visit: Payer: Self-pay

## 2012-12-16 DIAGNOSIS — Z1231 Encounter for screening mammogram for malignant neoplasm of breast: Secondary | ICD-10-CM

## 2012-12-18 ENCOUNTER — Other Ambulatory Visit: Payer: Self-pay

## 2012-12-18 DIAGNOSIS — E039 Hypothyroidism, unspecified: Secondary | ICD-10-CM

## 2012-12-18 MED ORDER — LEVOTHYROXINE SODIUM 88 MCG PO TABS: 88.0000 ug | ORAL_TABLET | Freq: Every day | ORAL | Status: DC

## 2013-01-22 ENCOUNTER — Inpatient Hospital Stay: Admission: RE | Admit: 2013-01-22 | Payer: Managed Care, Other (non HMO) | Source: Ambulatory Visit

## 2013-01-26 ENCOUNTER — Ambulatory Visit
Admission: RE | Admit: 2013-01-26 | Discharge: 2013-01-26 | Disposition: A | Discharge status: Home / Self Care | Payer: BC Managed Care – PPO | Source: Ambulatory Visit

## 2013-01-26 DIAGNOSIS — Z1231 Encounter for screening mammogram for malignant neoplasm of breast: Secondary | ICD-10-CM

## 2013-04-01 ENCOUNTER — Other Ambulatory Visit: Payer: Self-pay | Admitting: *Deleted

## 2013-04-01 DIAGNOSIS — E039 Hypothyroidism, unspecified: Secondary | ICD-10-CM

## 2013-04-01 MED ORDER — LEVOTHYROXINE SODIUM 88 MCG PO TABS: 88.0000 ug | ORAL_TABLET | Freq: Every day | ORAL | Status: DC

## 2013-04-01 NOTE — Telephone Encounter (Signed)
Received cal from [facility] stating that pt would like her synthroid changed to her local pharmacy rather than the mail order. This was done

## 2013-05-28 ENCOUNTER — Ambulatory Visit (INDEPENDENT_AMBULATORY_CARE_PROVIDER_SITE_OTHER): Payer: BC Managed Care – PPO | Admitting: Internal Medicine

## 2013-05-28 ENCOUNTER — Encounter: Payer: Self-pay | Admitting: Internal Medicine

## 2013-05-28 VITALS — BP 109/67 | HR 75 | Temp 98.6°F | Wt 212.0 lb

## 2013-05-28 DIAGNOSIS — J309 Allergic rhinitis, unspecified: Secondary | ICD-10-CM

## 2013-05-28 DIAGNOSIS — J302 Other seasonal allergic rhinitis: Secondary | ICD-10-CM

## 2013-05-28 DIAGNOSIS — J029 Acute pharyngitis, unspecified: Secondary | ICD-10-CM

## 2013-05-28 LAB — POCT RAPID STREP A (OFFICE): RAPID STREP A SCREEN: NEGATIVE

## 2013-05-28 NOTE — Progress Notes (Signed)
   Subjective:    Patient ID: Leah Ingram, female    DOB: 1970-04-02, 43 y.o.   MRN: [id number]  DOS:  05/28/2013 Type of  visit: Acute visit 2 and half weeks history of sore throat, on and off, described as a scratchy throat. Ears feel congested. Her daughter was diagnosed with a strep throat, she is on her second round of antibiotics.  ROS No fever, chills? No cough + Sinus pain and congestion, no nasal discharge. Admits to itchy eyes, nose and sneezing.   Past Medical History  Diagnosis Date  . Thyroid disease     Hypothyroidism    Past Surgical History  Procedure Laterality Date  . Foot surgery  2002    History   Social History  . Marital Status: Married    Spouse Name: N/A    Number of Children: N/A  . 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 Smoking status: Never Smoker   . Smokeless tobacco: Never Used  . Alcohol Use: Yes     Comment: very rare  . Drug Use: No  . Sexual Activity: Yes    Partners: Male   Other Topics Concern  . Not on file   Social History Narrative   Exercise--  Just started fitness pal        Medication List       This list is accurate as of: 05/28/13 11:59 PM.  Always use your most recent med list.               levothyroxine 88 MCG tablet  Commonly known as:  SYNTHROID, LEVOTHROID  Take 1 tablet (88 mcg total) by mouth daily.     prenatal multivitamin 60-1 MG tablet  Take 1 tablet by mouth daily.           Objective:   Physical Exam BP 109/67  Pulse 75  Temp(Src) 98.6 F (37 C) (Oral)  Wt 212 lb (96.163 kg)  SpO2 100%  LMP 05/06/2013  General -- alert, well-developed, NAD.  HEENT-- Not pale. TMs slt bulge ,no red or d/c;  throat symmetric, no redness or discharge. Face symmetric, sinuses not tender to palpation. Nose  slt congested.  Lungs -- normal respiratory effort, no intercostal retractions, no accessory muscle use, and normal breath sounds.    Heart-- normal rate, regular rhythm, no murmur.  Neurologic--  alert & oriented X3.   Psych-- Cognition and judgment appear intact. Cooperative with normal attention span and concentration. No anxious or depressed appearing.       Assessment & Plan:    Allergies, Presents with sore throat, strep test negative, exam is benign. Symptoms likely due to allergies. See instructions.

## 2013-05-28 NOTE — Patient Instructions (Signed)
Rest, fluids , tylenol use OTC Nasocort: 2 nasal sprays on each side of the nose daily until you feel better Also OTC claritin 10 mg 1 a day  Call if no better in few days Call anytime if the symptom are severe

## 2013-05-28 NOTE — Progress Notes (Signed)
Pre visit review using our clinic review tool, if applicable. No additional management support is needed unless otherwise documented below in the visit note. 

## 2013-10-07 ENCOUNTER — Other Ambulatory Visit: Payer: Self-pay

## 2013-10-07 MED ORDER — LEVOTHYROXINE SODIUM 88 MCG PO TABS: 88.0000 ug | ORAL_TABLET | Freq: Every day | ORAL | Status: DC

## 2013-10-15 ENCOUNTER — Other Ambulatory Visit: Payer: Self-pay

## 2013-10-15 ENCOUNTER — Telehealth: Payer: Self-pay | Admitting: Family Medicine

## 2013-10-15 MED ORDER — LEVOTHYROXINE SODIUM 88 MCG PO TABS: 88.0000 ug | ORAL_TABLET | Freq: Every day | ORAL | Status: DC

## 2013-10-15 MED ORDER — LEVOTHYROXINE SODIUM 88 MCG PO TABS: 88.0000 ug | ORAL_TABLET | Freq: Every day | ORAL | Status: AC

## 2013-10-15 NOTE — Telephone Encounter (Signed)
Caller name:Leah Ingram, Leah Ingram Relation to JX:BJYN Call back number:[phone] Pharmacy:[facility]  Reason for call: pt states she is completely out of her levothyroxine (SYNTHROID, LEVOTHROID) 88 MCG tablet Wants to know if she can get some pills sent to a local pharmacy [facility]

## 2013-10-15 NOTE — Telephone Encounter (Signed)
Patient aware Rx sent       KP 

## 2013-11-22 ENCOUNTER — Encounter: Payer: BC Managed Care – PPO | Admitting: Family Medicine

## 2014-01-20 ENCOUNTER — Other Ambulatory Visit: Payer: Self-pay | Admitting: *Deleted

## 2014-01-20 NOTE — Telephone Encounter (Signed)
[facility]. Forwarded to [facility]. [id number]

## 2014-01-21 HISTORY — PX: BREAST BIOPSY: SHX20

## 2014-06-30 ENCOUNTER — Other Ambulatory Visit: Payer: Self-pay

## 2014-06-30 DIAGNOSIS — Z1231 Encounter for screening mammogram for malignant neoplasm of breast: Secondary | ICD-10-CM

## 2014-07-06 ENCOUNTER — Ambulatory Visit
Admission: RE | Admit: 2014-07-06 | Discharge: 2014-07-06 | Disposition: A | Discharge status: Home / Self Care | Payer: BLUE CROSS/BLUE SHIELD | Source: Ambulatory Visit

## 2014-07-06 DIAGNOSIS — Z1231 Encounter for screening mammogram for malignant neoplasm of breast: Secondary | ICD-10-CM

## 2014-07-08 ENCOUNTER — Other Ambulatory Visit: Payer: Self-pay | Admitting: Nurse Practitioner

## 2014-07-08 DIAGNOSIS — R928 Other abnormal and inconclusive findings on diagnostic imaging of breast: Secondary | ICD-10-CM

## 2014-07-18 ENCOUNTER — Other Ambulatory Visit: Payer: Self-pay | Admitting: Nurse Practitioner

## 2014-07-18 DIAGNOSIS — R928 Other abnormal and inconclusive findings on diagnostic imaging of breast: Secondary | ICD-10-CM

## 2014-07-19 ENCOUNTER — Ambulatory Visit
Admission: RE | Admit: 2014-07-19 | Discharge: 2014-07-19 | Disposition: A | Discharge status: Home / Self Care | Payer: BLUE CROSS/BLUE SHIELD | Source: Ambulatory Visit | Attending: Nurse Practitioner | Admitting: Nurse Practitioner

## 2014-07-19 ENCOUNTER — Other Ambulatory Visit: Payer: Self-pay | Admitting: Nurse Practitioner

## 2014-07-19 DIAGNOSIS — R928 Other abnormal and inconclusive findings on diagnostic imaging of breast: Secondary | ICD-10-CM

## 2014-07-28 ENCOUNTER — Other Ambulatory Visit: Payer: Self-pay | Admitting: Nurse Practitioner

## 2014-07-28 ENCOUNTER — Ambulatory Visit
Admission: RE | Admit: 2014-07-28 | Discharge: 2014-07-28 | Disposition: A | Discharge status: Home / Self Care | Payer: BLUE CROSS/BLUE SHIELD | Source: Ambulatory Visit | Attending: Nurse Practitioner | Admitting: Nurse Practitioner

## 2014-07-28 DIAGNOSIS — R928 Other abnormal and inconclusive findings on diagnostic imaging of breast: Secondary | ICD-10-CM

## 2014-11-17 ENCOUNTER — Other Ambulatory Visit: Payer: Self-pay | Admitting: Nurse Practitioner

## 2014-11-17 DIAGNOSIS — Z9889 Other specified postprocedural states: Secondary | ICD-10-CM

## 2015-02-10 ENCOUNTER — Ambulatory Visit
Admission: RE | Admit: 2015-02-10 | Discharge: 2015-02-10 | Disposition: A | Discharge status: Home / Self Care | Payer: BLUE CROSS/BLUE SHIELD | Source: Ambulatory Visit | Attending: Nurse Practitioner | Admitting: Nurse Practitioner

## 2015-02-10 ENCOUNTER — Other Ambulatory Visit: Payer: Self-pay | Admitting: Nurse Practitioner

## 2015-02-10 DIAGNOSIS — Z9889 Other specified postprocedural states: Secondary | ICD-10-CM

## 2015-02-10 DIAGNOSIS — N6320 Unspecified lump in the left breast, unspecified quadrant: Secondary | ICD-10-CM

## 2015-08-01 ENCOUNTER — Other Ambulatory Visit: Payer: Self-pay | Admitting: Nurse Practitioner

## 2015-08-01 DIAGNOSIS — N63 Unspecified lump in unspecified breast: Secondary | ICD-10-CM

## 2015-08-11 ENCOUNTER — Ambulatory Visit
Admission: RE | Admit: 2015-08-11 | Discharge: 2015-08-11 | Disposition: A | Discharge status: Home / Self Care | Payer: BLUE CROSS/BLUE SHIELD | Source: Ambulatory Visit | Attending: Nurse Practitioner | Admitting: Nurse Practitioner

## 2015-08-11 DIAGNOSIS — N63 Unspecified lump in unspecified breast: Secondary | ICD-10-CM

## 2016-07-01 ENCOUNTER — Other Ambulatory Visit: Payer: Self-pay | Admitting: Nurse Practitioner

## 2016-07-01 DIAGNOSIS — N6489 Other specified disorders of breast: Secondary | ICD-10-CM

## 2016-08-16 ENCOUNTER — Ambulatory Visit: Payer: BLUE CROSS/BLUE SHIELD

## 2016-08-16 ENCOUNTER — Ambulatory Visit
Admission: RE | Admit: 2016-08-16 | Discharge: 2016-08-16 | Disposition: A | Discharge status: Home / Self Care | Payer: 59 | Source: Ambulatory Visit | Attending: Nurse Practitioner | Admitting: Nurse Practitioner

## 2016-08-16 ENCOUNTER — Other Ambulatory Visit: Payer: BLUE CROSS/BLUE SHIELD

## 2016-08-16 DIAGNOSIS — N6489 Other specified disorders of breast: Secondary | ICD-10-CM

## 2017-07-31 ENCOUNTER — Other Ambulatory Visit: Payer: Self-pay | Admitting: Nurse Practitioner

## 2017-07-31 DIAGNOSIS — N6489 Other specified disorders of breast: Secondary | ICD-10-CM

## 2017-08-18 ENCOUNTER — Ambulatory Visit
Admission: RE | Admit: 2017-08-18 | Discharge: 2017-08-18 | Disposition: A | Discharge status: Home / Self Care | Payer: 59 | Source: Ambulatory Visit | Attending: Nurse Practitioner | Admitting: Nurse Practitioner

## 2017-08-18 DIAGNOSIS — N6489 Other specified disorders of breast: Secondary | ICD-10-CM

## 2018-07-08 ENCOUNTER — Other Ambulatory Visit: Payer: Self-pay | Admitting: Nurse Practitioner

## 2018-07-08 DIAGNOSIS — Z1231 Encounter for screening mammogram for malignant neoplasm of breast: Secondary | ICD-10-CM

## 2018-08-21 ENCOUNTER — Ambulatory Visit
Admission: RE | Admit: 2018-08-21 | Discharge: 2018-08-21 | Disposition: A | Discharge status: Home / Self Care | Payer: BC Managed Care – PPO | Source: Ambulatory Visit | Attending: Nurse Practitioner | Admitting: Nurse Practitioner

## 2018-08-21 ENCOUNTER — Other Ambulatory Visit: Payer: Self-pay

## 2018-08-21 DIAGNOSIS — Z1231 Encounter for screening mammogram for malignant neoplasm of breast: Secondary | ICD-10-CM

## 2019-07-16 ENCOUNTER — Other Ambulatory Visit: Payer: Self-pay | Admitting: Nurse Practitioner

## 2019-07-16 DIAGNOSIS — Z1231 Encounter for screening mammogram for malignant neoplasm of breast: Secondary | ICD-10-CM

## 2019-08-23 ENCOUNTER — Other Ambulatory Visit: Payer: Self-pay

## 2019-08-23 ENCOUNTER — Ambulatory Visit
Admission: RE | Admit: 2019-08-23 | Discharge: 2019-08-23 | Disposition: A | Discharge status: Home / Self Care | Payer: BC Managed Care – PPO | Source: Ambulatory Visit | Attending: Nurse Practitioner | Admitting: Nurse Practitioner

## 2019-08-23 DIAGNOSIS — Z1231 Encounter for screening mammogram for malignant neoplasm of breast: Secondary | ICD-10-CM

## 2020-06-16 ENCOUNTER — Other Ambulatory Visit: Payer: Self-pay | Admitting: Nurse Practitioner

## 2020-06-16 DIAGNOSIS — Z1231 Encounter for screening mammogram for malignant neoplasm of breast: Secondary | ICD-10-CM

## 2020-08-25 ENCOUNTER — Ambulatory Visit
Admission: RE | Admit: 2020-08-25 | Discharge: 2020-08-25 | Disposition: A | Discharge status: Home / Self Care | Payer: BC Managed Care – PPO | Source: Ambulatory Visit | Attending: Nurse Practitioner | Admitting: Nurse Practitioner

## 2020-08-25 ENCOUNTER — Other Ambulatory Visit: Payer: Self-pay

## 2020-08-25 ENCOUNTER — Inpatient Hospital Stay: Admission: RE | Admit: 2020-08-25 | Payer: BC Managed Care – PPO | Source: Ambulatory Visit

## 2020-08-25 DIAGNOSIS — Z1231 Encounter for screening mammogram for malignant neoplasm of breast: Secondary | ICD-10-CM

## 2020-08-30 ENCOUNTER — Other Ambulatory Visit: Payer: Self-pay | Admitting: Nurse Practitioner

## 2020-08-30 DIAGNOSIS — R928 Other abnormal and inconclusive findings on diagnostic imaging of breast: Secondary | ICD-10-CM

## 2020-09-15 ENCOUNTER — Ambulatory Visit: Payer: BC Managed Care – PPO

## 2020-09-15 ENCOUNTER — Other Ambulatory Visit: Payer: Self-pay

## 2020-09-15 ENCOUNTER — Ambulatory Visit
Admission: RE | Admit: 2020-09-15 | Discharge: 2020-09-15 | Disposition: A | Discharge status: Home / Self Care | Payer: BC Managed Care – PPO | Source: Ambulatory Visit | Attending: Nurse Practitioner | Admitting: Nurse Practitioner

## 2020-09-15 DIAGNOSIS — R928 Other abnormal and inconclusive findings on diagnostic imaging of breast: Secondary | ICD-10-CM

## 2021-06-08 ENCOUNTER — Other Ambulatory Visit: Payer: Self-pay | Admitting: Nurse Practitioner

## 2021-06-08 DIAGNOSIS — Z1231 Encounter for screening mammogram for malignant neoplasm of breast: Secondary | ICD-10-CM

## 2021-09-20 ENCOUNTER — Ambulatory Visit
Admission: RE | Admit: 2021-09-20 | Discharge: 2021-09-20 | Disposition: A | Discharge status: Home / Self Care | Payer: BC Managed Care – PPO | Source: Ambulatory Visit

## 2021-09-20 DIAGNOSIS — Z1231 Encounter for screening mammogram for malignant neoplasm of breast: Secondary | ICD-10-CM

## 2022-07-24 ENCOUNTER — Other Ambulatory Visit: Payer: Self-pay

## 2022-07-24 DIAGNOSIS — Z0000 Encounter for general adult medical examination without abnormal findings: Secondary | ICD-10-CM

## 2022-10-04 ENCOUNTER — Ambulatory Visit
Admission: RE | Admit: 2022-10-04 | Discharge: 2022-10-04 | Disposition: A | Discharge status: Home / Self Care | Payer: Self-pay | Source: Ambulatory Visit | Attending: Nurse Practitioner | Admitting: Nurse Practitioner

## 2022-10-04 DIAGNOSIS — Z0000 Encounter for general adult medical examination without abnormal findings: Secondary | ICD-10-CM

## 2022-10-09 ENCOUNTER — Other Ambulatory Visit: Payer: Self-pay | Admitting: Nurse Practitioner

## 2022-10-09 DIAGNOSIS — R928 Other abnormal and inconclusive findings on diagnostic imaging of breast: Secondary | ICD-10-CM

## 2022-10-15 ENCOUNTER — Ambulatory Visit
Admission: RE | Admit: 2022-10-15 | Discharge: 2022-10-15 | Disposition: A | Discharge status: Home / Self Care | Payer: Self-pay | Source: Ambulatory Visit | Attending: Nurse Practitioner | Admitting: Nurse Practitioner

## 2022-10-15 DIAGNOSIS — R928 Other abnormal and inconclusive findings on diagnostic imaging of breast: Secondary | ICD-10-CM

## 2023-09-04 ENCOUNTER — Other Ambulatory Visit: Payer: Self-pay | Admitting: Nurse Practitioner

## 2023-09-04 DIAGNOSIS — Z1231 Encounter for screening mammogram for malignant neoplasm of breast: Secondary | ICD-10-CM

## 2023-10-17 ENCOUNTER — Ambulatory Visit
Admission: RE | Admit: 2023-10-17 | Discharge: 2023-10-17 | Disposition: A | Discharge status: Home / Self Care | Payer: Self-pay | Source: Ambulatory Visit | Attending: Nurse Practitioner | Admitting: Nurse Practitioner

## 2023-10-17 DIAGNOSIS — Z1231 Encounter for screening mammogram for malignant neoplasm of breast: Secondary | ICD-10-CM
